# Patient Record
Sex: Female | Born: 1985 | Race: White | Hispanic: No | Marital: Married | Smoking: Former smoker
Health system: Southern US, Community
[De-identification: ages and names within clinical notes are randomized; demographics above are authoritative.]

## PROBLEM LIST (undated history)

## (undated) DIAGNOSIS — Z8619 Personal history of other infectious and parasitic diseases: Secondary | ICD-10-CM

## (undated) DIAGNOSIS — O021 Missed abortion: Secondary | ICD-10-CM

## (undated) DIAGNOSIS — J069 Acute upper respiratory infection, unspecified: Secondary | ICD-10-CM

## (undated) DIAGNOSIS — E109 Type 1 diabetes mellitus without complications: Secondary | ICD-10-CM

## (undated) DIAGNOSIS — E1110 Type 2 diabetes mellitus with ketoacidosis without coma: Secondary | ICD-10-CM

## (undated) DIAGNOSIS — F329 Major depressive disorder, single episode, unspecified: Secondary | ICD-10-CM

## (undated) DIAGNOSIS — F32A Depression, unspecified: Secondary | ICD-10-CM

## (undated) HISTORY — DX: Acute upper respiratory infection, unspecified: J06.9

## (undated) HISTORY — DX: Personal history of other infectious and parasitic diseases: Z86.19

## (undated) HISTORY — PX: WISDOM TOOTH EXTRACTION: SHX21

---
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
## 2012-03-28 ENCOUNTER — Emergency Department (HOSPITAL_COMMUNITY): Payer: BC Managed Care – PPO

## 2012-03-28 ENCOUNTER — Emergency Department (HOSPITAL_COMMUNITY)
Admission: EM | Admit: 2012-03-28 | Discharge: 2012-03-29 | Disposition: A | Discharge status: Home / Self Care | Payer: BC Managed Care – PPO | Attending: Emergency Medicine | Admitting: Emergency Medicine

## 2012-03-28 ENCOUNTER — Encounter (HOSPITAL_COMMUNITY): Payer: Self-pay | Admitting: Adult Health

## 2012-03-28 DIAGNOSIS — IMO0002 Reserved for concepts with insufficient information to code with codable children: Secondary | ICD-10-CM | POA: Insufficient documentation

## 2012-03-28 DIAGNOSIS — M7580 Other shoulder lesions, unspecified shoulder: Secondary | ICD-10-CM

## 2012-03-28 DIAGNOSIS — Z3202 Encounter for pregnancy test, result negative: Secondary | ICD-10-CM | POA: Insufficient documentation

## 2012-03-28 DIAGNOSIS — M75100 Unspecified rotator cuff tear or rupture of unspecified shoulder, not specified as traumatic: Secondary | ICD-10-CM | POA: Insufficient documentation

## 2012-03-28 DIAGNOSIS — R202 Paresthesia of skin: Secondary | ICD-10-CM

## 2012-03-28 DIAGNOSIS — R209 Unspecified disturbances of skin sensation: Secondary | ICD-10-CM | POA: Insufficient documentation

## 2012-03-28 DIAGNOSIS — F17200 Nicotine dependence, unspecified, uncomplicated: Secondary | ICD-10-CM | POA: Insufficient documentation

## 2012-03-28 DIAGNOSIS — E119 Type 2 diabetes mellitus without complications: Secondary | ICD-10-CM | POA: Insufficient documentation

## 2012-03-28 DIAGNOSIS — Z794 Long term (current) use of insulin: Secondary | ICD-10-CM | POA: Insufficient documentation

## 2012-03-28 HISTORY — DX: Type 1 diabetes mellitus without complications: E10.9

## 2012-03-28 LAB — BASIC METABOLIC PANEL
BUN: 11 mg/dL (ref 6–23)
CO2: 29 mEq/L (ref 19–32)
Chloride: 105 mEq/L (ref 96–112)
Creatinine, Ser: 0.75 mg/dL (ref 0.50–1.10)
Potassium: 4.2 mEq/L (ref 3.5–5.1)

## 2012-03-28 LAB — CBC WITH DIFFERENTIAL/PLATELET
HCT: 39.6 % (ref 36.0–46.0)
Hemoglobin: 13.7 g/dL (ref 12.0–15.0)
Lymphocytes Relative: 44 % (ref 12–46)
Monocytes Absolute: 0.5 10*3/uL (ref 0.1–1.0)
Monocytes Relative: 8 % (ref 3–12)
Neutro Abs: 2.5 10*3/uL (ref 1.7–7.7)
Neutrophils Relative %: 44 % (ref 43–77)
RBC: 4.55 MIL/uL (ref 3.87–5.11)
WBC: 5.8 10*3/uL (ref 4.0–10.5)

## 2012-03-28 LAB — GLUCOSE, CAPILLARY: Glucose-Capillary: 190 mg/dL — ABNORMAL HIGH (ref 70–99)

## 2012-03-28 NOTE — ED Notes (Addendum)
Pt reports right sided pain and heaviness  that began 45 minutes ago while driving, goes from right side of face to right arm to right leg.  denies blurred vision, denies SOB. No facial droop, no arm drift. Grip is equal bilaterally. C/o headache that began today but is currently gone. Alert and oriented, answers all questions appropriately. Hx of diabetes on insulin pump. Reports high stress lately. Pt is tearful. Denies SOB and chest pain.

## 2012-03-28 NOTE — ED Notes (Signed)
CBG checked 190

## 2012-03-29 MED ORDER — ASPIRIN 81 MG PO CHEW: 81.0000 mg | CHEWABLE_TABLET | Freq: Every day | ORAL | Status: DC

## 2012-03-29 NOTE — ED Notes (Signed)
Patient says she started feeling "awful tingling, pressure, lead weight" from her right shoulder down to her fingertips.  Patient says she is a diabetic and the feeling just scared her so she came to ED.

## 2012-03-29 NOTE — ED Notes (Signed)
Dr Otter at bedside  

## 2012-03-30 NOTE — ED Provider Notes (Signed)
History     CSN: [id number]  Arrival date & time 03/28/12  2101   First MD Initiated Contact with Patient 03/29/12 0349      Chief Complaint  Patient presents with  . Numbness    (Consider location/radiation/quality/duration/timing/severity/associated sxs/prior treatment) HPI 26 yo female presents to the ER with complaint of right shoulder pain over the last few weeks, with pins and needles/heaviness sensation in right arm, leg, and face this evening for about 4-5 hours.  Pt had headache earlier, but not during numbness feeling.  Pt reports she and her husband had finished dinner and were driving home when symptoms began.  No weakness or loss of function.  No slurred speech or difficulties with thinking.  No prior h/o same.  Pt has h/o diabetes, became concerned for stroke and presented to ED.  Negative stroke scale at presentation to triage.  Sxs all now resolved aside from right shoulder pain.  No known injury to right shoulder, but pt reports she moves furniture often.  Pain is usually worse at night/first thing in morning, pain with overhead activity.   Past Medical History  Diagnosis Date  . Diabetes mellitus type I     History reviewed. No pertinent past surgical history.  History reviewed. No pertinent family history.  History  Substance Use Topics  . Smoking status: Current Some Day Smoker  . Smokeless tobacco: Not on file  . Alcohol Use: No    OB History    Grav Para Term Preterm Abortions TAB SAB Ect Mult Living                  Review of Systems  All other systems reviewed and are negative.    Allergies  Review of patient's allergies indicates no known allergies.  Home Medications   Current Outpatient Rx  Name  Route  Sig  Dispense  Refill  . INSULIN PUMP   Subcutaneous   Inject into the skin.         . ASPIRIN 81 MG PO CHEW   Oral   Chew 1 tablet (81 mg total) by mouth daily.   30 tablet   0     BP 110/75  Pulse 75  Temp 97.7 F (36.5 C)  (Oral)  Resp 17  SpO2 100%  LMP 03/28/2012  Physical Exam  Nursing note and vitals reviewed. Constitutional: She is oriented to person, place, and time. She appears well-developed and well-nourished.  HENT:  Head: Normocephalic and atraumatic.  Right Ear: External ear normal.  Left Ear: External ear normal.  Nose: Nose normal.  Mouth/Throat: Oropharynx is clear and moist.  Eyes: Conjunctivae normal and EOM are normal. Pupils are equal, round, and reactive to light.  Neck: Normal range of motion. Neck supple. No JVD present. No tracheal deviation present. No thyromegaly present.  Cardiovascular: Normal rate, regular rhythm, normal heart sounds and intact distal pulses.  Exam reveals no gallop and no friction rub.   No murmur heard. Pulmonary/Chest: Effort normal and breath sounds normal. No stridor. No respiratory distress. She has no wheezes. She has no rales. She exhibits no tenderness.  Abdominal: Soft. Bowel sounds are normal. She exhibits no distension and no mass. There is no tenderness. There is no rebound and no guarding.  Musculoskeletal: Normal range of motion. She exhibits tenderness (TTP over rotator cuff, pain with impingment testing). She exhibits no edema.  Lymphadenopathy:    She has no cervical adenopathy.  Neurological: She is alert and oriented to person,  place, and time. She has normal reflexes. No cranial nerve deficit. She exhibits normal muscle tone. Coordination normal.  Skin: Skin is dry. No rash noted. No erythema. No pallor.  Psychiatric: She has a normal mood and affect. Her behavior is normal. Judgment and thought content normal.    ED Course  Procedures (including critical care time)  Labs Reviewed  BASIC METABOLIC PANEL - Abnormal; Notable for the following:    Glucose, Bld 204 (*)     All other components within normal limits  GLUCOSE, CAPILLARY - Abnormal; Notable for the following:    Glucose-Capillary 190 (*)     All other components within  normal limits  CBC WITH DIFFERENTIAL  POCT PREGNANCY, URINE  LAB REPORT - SCANNED   Ct Head Wo Contrast  03/28/2012  *RADIOLOGY REPORT*  Clinical Data: Headache, numbness.  CT HEAD WITHOUT CONTRAST  Technique:  Contiguous axial images were obtained from the base of the skull through the vertex without contrast.  Comparison: None.  Findings: No acute intracranial abnormality.  Specifically, no hemorrhage, hydrocephalus, mass lesion, acute infarction, or significant intracranial injury.  No acute calvarial abnormality. Visualized paranasal sinuses and mastoids clear.  Orbital soft tissues unremarkable.  IMPRESSION: Normal study.   Original Report Authenticated By: Charlett Nose, M.D.      1. Paresthesia of right arm and leg   2. Rotator cuff tendinitis       MDM  26 yo female with right sided paresthesias, also right shoulder pain.  Workup unremarkable.  Possible atypical migraine.  Will have her f/u with outpatient neurology. Do not feel sxs are due to TIA, stroke.        Olivia Mackie, MD 03/30/12 [phone]

## 2013-06-23 ENCOUNTER — Encounter (HOSPITAL_COMMUNITY): Payer: Self-pay | Admitting: Emergency Medicine

## 2013-06-23 ENCOUNTER — Emergency Department (HOSPITAL_COMMUNITY): Payer: BC Managed Care – PPO

## 2013-06-23 ENCOUNTER — Inpatient Hospital Stay (HOSPITAL_COMMUNITY)
Admission: EM | Admit: 2013-06-23 | Discharge: 2013-06-25 | DRG: 638 | Disposition: A | Discharge status: Home / Self Care | Payer: BC Managed Care – PPO | Attending: Internal Medicine | Admitting: Internal Medicine

## 2013-06-23 DIAGNOSIS — E109 Type 1 diabetes mellitus without complications: Secondary | ICD-10-CM | POA: Diagnosis present

## 2013-06-23 DIAGNOSIS — E1110 Type 2 diabetes mellitus with ketoacidosis without coma: Secondary | ICD-10-CM | POA: Diagnosis present

## 2013-06-23 DIAGNOSIS — E871 Hypo-osmolality and hyponatremia: Secondary | ICD-10-CM | POA: Diagnosis present

## 2013-06-23 DIAGNOSIS — J111 Influenza due to unidentified influenza virus with other respiratory manifestations: Secondary | ICD-10-CM

## 2013-06-23 DIAGNOSIS — R110 Nausea: Secondary | ICD-10-CM | POA: Diagnosis present

## 2013-06-23 DIAGNOSIS — F329 Major depressive disorder, single episode, unspecified: Secondary | ICD-10-CM

## 2013-06-23 DIAGNOSIS — F32A Depression, unspecified: Secondary | ICD-10-CM | POA: Diagnosis present

## 2013-06-23 DIAGNOSIS — E875 Hyperkalemia: Secondary | ICD-10-CM | POA: Diagnosis present

## 2013-06-23 DIAGNOSIS — R000 Tachycardia, unspecified: Secondary | ICD-10-CM | POA: Diagnosis present

## 2013-06-23 DIAGNOSIS — F3289 Other specified depressive episodes: Secondary | ICD-10-CM | POA: Diagnosis present

## 2013-06-23 DIAGNOSIS — F17200 Nicotine dependence, unspecified, uncomplicated: Secondary | ICD-10-CM | POA: Diagnosis present

## 2013-06-23 DIAGNOSIS — Z833 Family history of diabetes mellitus: Secondary | ICD-10-CM

## 2013-06-23 DIAGNOSIS — Z9641 Presence of insulin pump (external) (internal): Secondary | ICD-10-CM

## 2013-06-23 DIAGNOSIS — E1010 Type 1 diabetes mellitus with ketoacidosis without coma: Principal | ICD-10-CM | POA: Diagnosis present

## 2013-06-23 DIAGNOSIS — Z794 Long term (current) use of insulin: Secondary | ICD-10-CM

## 2013-06-23 HISTORY — DX: Depression, unspecified: F32.A

## 2013-06-23 HISTORY — DX: Type 2 diabetes mellitus with ketoacidosis without coma: E11.10

## 2013-06-23 HISTORY — DX: Major depressive disorder, single episode, unspecified: F32.9

## 2013-06-23 LAB — BASIC METABOLIC PANEL
BUN: 25 mg/dL — AB (ref 6–23)
BUN: 25 mg/dL — ABNORMAL HIGH (ref 6–23)
BUN: 23 mg/dL (ref 6–23)
BUN: 21 mg/dL (ref 6–23)
BUN: 20 mg/dL (ref 6–23)
BUN: 20 mg/dL (ref 6–23)
CALCIUM: 8.3 mg/dL — AB (ref 8.4–10.5)
CALCIUM: 8.2 mg/dL — AB (ref 8.4–10.5)
CALCIUM: 8.3 mg/dL — AB (ref 8.4–10.5)
CHLORIDE: 103 meq/L (ref 96–112)
CHLORIDE: 106 meq/L (ref 96–112)
CO2: 14 mEq/L — ABNORMAL LOW (ref 19–32)
CO2: 12 meq/L — AB (ref 19–32)
CO2: 15 meq/L — AB (ref 19–32)
CO2: 19 mEq/L (ref 19–32)
CO2: 20 mEq/L (ref 19–32)
CO2: 21 mEq/L (ref 19–32)
CREATININE: 0.83 mg/dL (ref 0.50–1.10)
CREATININE: 0.79 mg/dL (ref 0.50–1.10)
CREATININE: 0.76 mg/dL (ref 0.50–1.10)
CREATININE: 0.73 mg/dL (ref 0.50–1.10)
Calcium: 9.3 mg/dL (ref 8.4–10.5)
Calcium: 8.2 mg/dL — ABNORMAL LOW (ref 8.4–10.5)
Calcium: 8.2 mg/dL — ABNORMAL LOW (ref 8.4–10.5)
Chloride: 93 mEq/L — ABNORMAL LOW (ref 96–112)
Chloride: 102 mEq/L (ref 96–112)
Chloride: 104 mEq/L (ref 96–112)
Chloride: 105 mEq/L (ref 96–112)
Creatinine, Ser: 0.79 mg/dL (ref 0.50–1.10)
Creatinine, Ser: 0.73 mg/dL (ref 0.50–1.10)
GFR calc Af Amer: >90 mL/min (ref >90)
GFR calc Af Amer: >90 mL/min (ref >90)
GFR calc Af Amer: >90 mL/min (ref >90)
GFR calc Af Amer: >90 mL/min (ref >90)
GFR calc Af Amer: >90 mL/min (ref >90)
GFR calc Af Amer: >90 mL/min (ref >90)
GFR calc non Af Amer: >90 mL/min (ref >90)
GLUCOSE: 503 mg/dL — AB (ref 70–99)
GLUCOSE: 335 mg/dL — AB (ref 70–99)
GLUCOSE: 198 mg/dL — AB (ref 70–99)
Glucose, Bld: 255 mg/dL — ABNORMAL HIGH (ref 70–99)
Glucose, Bld: 168 mg/dL — ABNORMAL HIGH (ref 70–99)
Glucose, Bld: 146 mg/dL — ABNORMAL HIGH (ref 70–99)
POTASSIUM: 6.5 meq/L — AB (ref 3.7–5.3)
POTASSIUM: 4.4 meq/L (ref 3.7–5.3)
Potassium: 4.3 mEq/L (ref 3.7–5.3)
Potassium: 4.5 mEq/L (ref 3.7–5.3)
Potassium: 4.7 mEq/L (ref 3.7–5.3)
Potassium: 4.6 mEq/L (ref 3.7–5.3)
SODIUM: 136 meq/L — AB (ref 137–147)
SODIUM: 135 meq/L — AB (ref 137–147)
Sodium: 131 mEq/L — ABNORMAL LOW (ref 137–147)
Sodium: 135 mEq/L — ABNORMAL LOW (ref 137–147)
Sodium: 135 mEq/L — ABNORMAL LOW (ref 137–147)
Sodium: 136 mEq/L — ABNORMAL LOW (ref 137–147)

## 2013-06-23 LAB — INFLUENZA PANEL BY PCR (TYPE A & B)
H1N1 flu by pcr: NOT DETECTED
INFLAPCR: NEGATIVE
INFLBPCR: NEGATIVE

## 2013-06-23 LAB — CBC WITH DIFFERENTIAL/PLATELET
BASOS PCT: 0 % (ref 0–1)
Basophils Absolute: 0 10*3/uL (ref 0.0–0.1)
EOS ABS: 0 10*3/uL (ref 0.0–0.7)
Eosinophils Relative: 0 % (ref 0–5)
HEMATOCRIT: 41 % (ref 36.0–46.0)
HEMOGLOBIN: 14.2 g/dL (ref 12.0–15.0)
Lymphocytes Relative: 6 % — ABNORMAL LOW (ref 12–46)
Lymphs Abs: 0.9 10*3/uL (ref 0.7–4.0)
MCH: 30.9 pg (ref 26.0–34.0)
MCHC: 34.6 g/dL (ref 30.0–36.0)
MCV: 89.3 fL (ref 78.0–100.0)
MONO ABS: 0.7 10*3/uL (ref 0.1–1.0)
MONOS PCT: 5 % (ref 3–12)
Neutro Abs: 12.1 10*3/uL — ABNORMAL HIGH (ref 1.7–7.7)
Neutrophils Relative %: 88 % — ABNORMAL HIGH (ref 43–77)
Platelets: 262 10*3/uL (ref 150–400)
RBC: 4.59 MIL/uL (ref 3.87–5.11)
RDW: 11.9 % (ref 11.5–15.5)
WBC: 13.7 10*3/uL — ABNORMAL HIGH (ref 4.0–10.5)

## 2013-06-23 LAB — GLUCOSE, CAPILLARY
GLUCOSE-CAPILLARY: 194 mg/dL — AB (ref 70–99)
GLUCOSE-CAPILLARY: 181 mg/dL — AB (ref 70–99)
GLUCOSE-CAPILLARY: 130 mg/dL — AB (ref 70–99)
GLUCOSE-CAPILLARY: 108 mg/dL — AB (ref 70–99)
Glucose-Capillary: 254 mg/dL — ABNORMAL HIGH (ref 70–99)
Glucose-Capillary: 237 mg/dL — ABNORMAL HIGH (ref 70–99)
Glucose-Capillary: 169 mg/dL — ABNORMAL HIGH (ref 70–99)
Glucose-Capillary: 158 mg/dL — ABNORMAL HIGH (ref 70–99)
Glucose-Capillary: 143 mg/dL — ABNORMAL HIGH (ref 70–99)

## 2013-06-23 LAB — CBG MONITORING, ED
Glucose-Capillary: 511 mg/dL — ABNORMAL HIGH (ref 70–99)
Glucose-Capillary: 466 mg/dL — ABNORMAL HIGH (ref 70–99)
Glucose-Capillary: 455 mg/dL — ABNORMAL HIGH (ref 70–99)
Glucose-Capillary: 423 mg/dL — ABNORMAL HIGH (ref 70–99)
Glucose-Capillary: 362 mg/dL — ABNORMAL HIGH (ref 70–99)

## 2013-06-23 LAB — OSMOLALITY: Osmolality: 312 mOsm/kg — ABNORMAL HIGH (ref 275–300)

## 2013-06-23 LAB — URINALYSIS, ROUTINE W REFLEX MICROSCOPIC
BILIRUBIN URINE: NEGATIVE
Glucose, UA: >1000 mg/dL — AB
HGB URINE DIPSTICK: NEGATIVE
Ketones, ur: >80 mg/dL — AB
Leukocytes, UA: NEGATIVE
Nitrite: NEGATIVE
PROTEIN: NEGATIVE mg/dL
Specific Gravity, Urine: 1.02 (ref 1.005–1.030)
UROBILINOGEN UA: 0.2 mg/dL (ref 0.0–1.0)
pH: 5.5 (ref 5.0–8.0)

## 2013-06-23 LAB — MRSA PCR SCREENING: MRSA BY PCR: NEGATIVE

## 2013-06-23 LAB — MAGNESIUM: MAGNESIUM: 2.1 mg/dL (ref 1.5–2.5)

## 2013-06-23 LAB — URINE MICROSCOPIC-ADD ON

## 2013-06-23 LAB — PREGNANCY, URINE: PREG TEST UR: NEGATIVE

## 2013-06-23 MED ORDER — ONDANSETRON HCL 4 MG/2ML IJ SOLN
4.0000 mg | Freq: Once | INTRAMUSCULAR | Status: AC
  Administered 2013-06-23: 4 mg via INTRAMUSCULAR
  Filled 2013-06-23: qty 2

## 2013-06-23 MED ORDER — SODIUM CHLORIDE 0.9 % IV SOLN
1000.0000 mL | INTRAVENOUS | Status: DC
  Administered 2013-06-23: 1000 mL via INTRAVENOUS

## 2013-06-23 MED ORDER — HYDROCODONE-ACETAMINOPHEN 5-325 MG PO TABS
2.0000 | ORAL_TABLET | Freq: Once | ORAL | Status: AC
  Administered 2013-06-23: 2 via ORAL
  Filled 2013-06-23: qty 2

## 2013-06-23 MED ORDER — DEXTROSE-NACL 5-0.45 % IV SOLN
INTRAVENOUS | Status: DC
  Administered 2013-06-23: 18:00:00 via INTRAVENOUS

## 2013-06-23 MED ORDER — SODIUM POLYSTYRENE SULFONATE 15 GM/60ML PO SUSP
15.0000 g | Freq: Once | ORAL | Status: AC
  Administered 2013-06-23: 15 g via ORAL
  Filled 2013-06-23: qty 60

## 2013-06-23 MED ORDER — SODIUM CHLORIDE 0.9 % IV SOLN
INTRAVENOUS | Status: DC
  Administered 2013-06-23 (×2): via INTRAVENOUS

## 2013-06-23 MED ORDER — MORPHINE SULFATE 2 MG/ML IJ SOLN
2.0000 mg | Freq: Once | INTRAMUSCULAR | Status: DC
  Filled 2013-06-23: qty 1

## 2013-06-23 MED ORDER — INSULIN ASPART 100 UNIT/ML ~~LOC~~ SOLN: 0.0000 [IU] | Freq: Three times a day (TID) | SUBCUTANEOUS | Status: DC

## 2013-06-23 MED ORDER — ACETAMINOPHEN 325 MG PO TABS: 650.0000 mg | ORAL_TABLET | Freq: Four times a day (QID) | ORAL | Status: DC | PRN

## 2013-06-23 MED ORDER — DEXTROSE 50 % IV SOLN: 25.0000 mL | INTRAVENOUS | Status: DC | PRN

## 2013-06-23 MED ORDER — INSULIN GLARGINE 100 UNIT/ML ~~LOC~~ SOLN
20.0000 [IU] | Freq: Two times a day (BID) | SUBCUTANEOUS | Status: DC
  Administered 2013-06-23: 20 [IU] via SUBCUTANEOUS
  Filled 2013-06-23 (×2): qty 0.2

## 2013-06-23 MED ORDER — SODIUM CHLORIDE 0.9 % IV SOLN
1000.0000 mL | Freq: Once | INTRAVENOUS | Status: AC
  Administered 2013-06-23: 1000 mL via INTRAVENOUS

## 2013-06-23 MED ORDER — ENOXAPARIN SODIUM 40 MG/0.4ML ~~LOC~~ SOLN
40.0000 mg | Freq: Every day | SUBCUTANEOUS | Status: DC
  Administered 2013-06-23 – 2013-06-25 (×3): 40 mg via SUBCUTANEOUS
  Filled 2013-06-23 (×3): qty 0.4

## 2013-06-23 MED ORDER — SERTRALINE HCL 50 MG PO TABS
50.0000 mg | ORAL_TABLET | Freq: Every day | ORAL | Status: DC
  Administered 2013-06-23 – 2013-06-25 (×3): 50 mg via ORAL
  Filled 2013-06-23 (×3): qty 1

## 2013-06-23 MED ORDER — OSELTAMIVIR PHOSPHATE 75 MG PO CAPS
75.0000 mg | ORAL_CAPSULE | Freq: Two times a day (BID) | ORAL | Status: DC
  Filled 2013-06-23: qty 1

## 2013-06-23 MED ORDER — SODIUM CHLORIDE 0.9 % IV SOLN
INTRAVENOUS | Status: DC
  Administered 2013-06-23: 4 [IU]/h via INTRAVENOUS
  Filled 2013-06-23: qty 1

## 2013-06-23 MED ORDER — DEXTROSE 50 % IV SOLN: 50.0000 mL | INTRAVENOUS | Status: DC | PRN

## 2013-06-23 MED ORDER — KETOROLAC TROMETHAMINE 30 MG/ML IJ SOLN
30.0000 mg | Freq: Once | INTRAMUSCULAR | Status: AC
  Administered 2013-06-23: 30 mg via INTRAVENOUS
  Filled 2013-06-23: qty 1

## 2013-06-23 MED ORDER — GLUCOSE 40 % PO GEL: 1.0000 | ORAL | Status: DC | PRN

## 2013-06-23 MED ORDER — SODIUM CHLORIDE 0.9 % IV SOLN
INTRAVENOUS | Status: DC
  Administered 2013-06-23: 16:00:00 via INTRAVENOUS
  Filled 2013-06-23: qty 1

## 2013-06-23 MED ORDER — OSELTAMIVIR PHOSPHATE 75 MG PO CAPS
75.0000 mg | ORAL_CAPSULE | Freq: Once | ORAL | Status: AC
  Administered 2013-06-23: 75 mg via ORAL
  Filled 2013-06-23: qty 1

## 2013-06-23 MED ORDER — ONDANSETRON HCL 4 MG/2ML IJ SOLN
4.0000 mg | Freq: Once | INTRAMUSCULAR | Status: AC
  Administered 2013-06-23: 4 mg via INTRAVENOUS
  Filled 2013-06-23: qty 2

## 2013-06-23 MED ORDER — ONDANSETRON HCL 4 MG/2ML IJ SOLN
4.0000 mg | Freq: Four times a day (QID) | INTRAMUSCULAR | Status: DC | PRN
  Administered 2013-06-24 (×2): 4 mg via INTRAVENOUS
  Filled 2013-06-23 (×2): qty 2

## 2013-06-23 MED ORDER — ENOXAPARIN SODIUM 40 MG/0.4ML ~~LOC~~ SOLN: 40.0000 mg | SUBCUTANEOUS | Status: DC

## 2013-06-23 MED ORDER — INSULIN ASPART 100 UNIT/ML ~~LOC~~ SOLN: 0.0000 [IU] | Freq: Every day | SUBCUTANEOUS | Status: DC

## 2013-06-23 MED ORDER — INSULIN ASPART 100 UNIT/ML ~~LOC~~ SOLN: 4.0000 [IU] | Freq: Three times a day (TID) | SUBCUTANEOUS | Status: DC

## 2013-06-23 NOTE — H&P (Signed)
[facility] History and Physical  Norma Allen AQT:[id number] DOB: Dec 03, 1985 DOA: 06/23/2013  Referring physician: zammit PCP: Norma Hilding, MD  Endocrinologist Dr. Reynold Bowen  Chief Complaint: vomiting  HPI: Norma Allen is a 28 y.o. female with a past medical history that includes type 1 diabetes she is on an insulin pump, depression presents to the emergency department today with the chief complaint of generalized achiness nausea vomiting. Initial workup in the emergency room reveals the patient is in DKA with an anion gap of 24, serum glucose 511, potassium 6.5. Vital signs were stable but she had a heart rate of 127. She was afebrile and not hypoxic   Information is obtained from the patient. She states that 2 days ago in the morning she developed sudden nausea. She states that she did not vomit that day she took some Phenergan and Pepto-Bismol  and slept most of the day. Her blood sugar at that time was 133. Later that evening she woke up and her nausea had subsided and she was able to eat dinner. She slept through the night without problem. However yesterday she awakened again with nausea and generalized achiness. She went to her primary care provider who diagnosed her with flu and started her on Tamiflu. She was also instructed that if she began to vomit she should come to the emergency department. She indicated that her blood sugars were running "high" during this time. The achiness and generalized weakness worsened. She was unable to eat. She began vomiting last evening and this morning she was no better so she came to the emergency room. She denies any fever chills dysuria hematuria frequency or urgency. She denies diarrhea or constipation. In the emergency department she was given 2 L of normal saline and an insulin drip was initiated. In addition she was given 15 g of Kayexalate, Zofran and Toradol. We are asked to admit    Review of Systems:  10 point review of systems  completed and all systems are negative except as indicated in the history of present illness   Past Medical History  Diagnosis Date  . Diabetes mellitus type I   . DKA (diabetic ketoacidoses)   . Depression    Past Surgical History  Procedure Laterality Date  . Wisdom tooth extraction     Social History:  reports that she has been smoking Cigarettes.  She has a 2 pack-year smoking history. She has never used smokeless tobacco. She reports that she does not drink alcohol or use illicit drugs. She smokes occasionally her last cigarette was 2 days ago. She is a social drinker. She denies any illicit drug use. 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 No Known Allergies  Family History  Problem Relation Age of Onset  . Cancer Mother   . Diabetes Father      Prior to Admission medications   Medication Sig Start Date End Date Taking? Authorizing Provider  fluticasone (FLONASE) 50 MCG/ACT nasal spray Place 1 spray into both nostrils daily. 03/22/13  Yes Historical Provider, MD  insulin glargine (LANTUS) 100 UNIT/ML injection Inject 32-36 Units into the skin 2 (two) times daily.    Yes Historical Provider, MD  NOVOLOG 100 UNIT/ML injection Inject 3.7-5.2 Units into the skin 3 (three) times daily with meals.  05/26/13  Yes Historical Provider, MD  sertraline (ZOLOFT) 50 MG tablet Take 50 mg by mouth daily. 05/22/13  Yes Historical Provider, MD  TAMIFLU 75 MG capsule Take 75 mg by  mouth 2 (two) times daily. 06/22/13  Yes Historical Provider, MD  Insulin Human (INSULIN PUMP) 100 unit/ml SOLN Inject into the skin continuous.     Historical Provider, MD   Physical Exam: Filed Vitals:   06/23/13 1129  BP: 114/47  Pulse: 127  Temp: 97.7 F (36.5 C)  Resp: 25    BP 114/47  Pulse 127  Temp(Src) 97.7 F (36.5 C) (Oral)  Resp 25  Ht _0  (1.676 m)  Wt 63.957 kg (141 lb)  BMI 22.77 kg/m2  SpO2 100%  LMP 06/16/2013  General:  Well-nourished somewhat ill  appearing no acute distress Eyes: PERRL, normal lids, irises & conjunctiva ENT: Ears are clear nose without drainage oropharynx without erythema or exudate. Mucous membranes of her mouth are pink but dry Neck: no LAD, masses or thyromegaly Cardiovascular: Tachycardic but regular, no m/r/g. No LE edema. Respiratory: CTA bilaterally, no w/r/r. Normal respiratory effort. Abdomen: soft, positive bowel sounds but somewhat sluggish. Mild tenderness to epigastric area on palpation. Otherwise nontender nondistended. No mass organomegaly noted Skin: no rash or induration seen on limited exam Musculoskeletal: grossly normal tone BUE/BLE Psychiatric: grossly normal mood and affect, speech fluent and appropriate Neurologic: grossly non-focal.          Labs on Admission:  Basic Metabolic Panel:  Recent Labs Lab 06/23/13 1023  NA 131*  K 6.5*  CL 93*  CO2 14*  GLUCOSE 503*  BUN 25*  CREATININE 0.79  CALCIUM 9.3  MG 2.1   Liver Function Tests: No results found for this basename: AST, ALT, ALKPHOS, BILITOT, PROT, ALBUMIN,  in the last 168 hours No results found for this basename: LIPASE, AMYLASE,  in the last 168 hours No results found for this basename: AMMONIA,  in the last 168 hours CBC:  Recent Labs Lab 06/23/13 0856  WBC 13.7*  NEUTROABS 12.1*  HGB 14.2  HCT 41.0  MCV 89.3  PLT 262   Cardiac Enzymes: No results found for this basename: CKTOTAL, CKMB, CKMBINDEX, TROPONINI,  in the last 168 hours  BNP (last 3 results) No results found for this basename: PROBNP,  in the last 8760 hours CBG:  Recent Labs Lab 06/23/13 0836 06/23/13 1006 06/23/13 1134  GLUCAP 511* 466* 455*    Radiological Exams on Admission: Dg Chest Portable 1 View  06/23/2013   CLINICAL DATA:  Influenza.  Smoker.  EXAM: PORTABLE CHEST - 1 VIEW  COMPARISON:  No priors.  FINDINGS: Lung volumes are normal. No consolidative airspace disease. No pleural effusions. No pneumothorax. No pulmonary nodule or  mass noted. Pulmonary vasculature and the cardiomediastinal silhouette are within normal limits.  IMPRESSION: 1.  No radiographic evidence of acute cardiopulmonary disease.   Electronically Signed   By: Vinnie Langton M.D.   On: 06/23/2013 12:21    EKG: Independently reviewed sinus tachycardia with nonspecific ST-T wave Assessment/Plan Principal Problem:   DKA (diabetic ketoacidoses): Likely related to acute illness in the setting of slight noncompliance due to insulin calm issues. Chest x-ray and urinalysis are unremarkable. Will admit to step down. Will provide insulin drip and follow Glucomander protocol. Monitor be met every 4 hours per protocol. We'll transition her fluids when indicated. Will also request diabetic coordinator consult to assist with transitioning her back to her pump when indicated. Will keep her n.p.o. except for ice chips for now.  Active Problems: Hyperkalemia: Related to above. She was given 15 g of Kayexalate in the emergency department. He was also given 2 L of normal  saline. Will monitor electrolytes as indicated above. I expect her potassium level to improve as her acidosis resolves.    Hyponatremia: Related to #1. IV fluids per protocol. Basic metabolic panel every 4 hours. Will follow closely    Tachycardia: Related to #1. She has been given 2 L of normal saline I will continue vigorous IV resuscitation. EKG with some nonspecific ST and T wave abnormalities. Will repeat EKG in the a.m.     Nausea: Had some vomiting last night and this morning. Denies coffee ground emesis. May be related to flulike illness complicated by DKA. Influenza panel pending. Will continue her Tamiflu until those results are back. Will keep her n.p.o. except for ice chips. Will provide supportive therapy. Expect this will resolve as #1 improves.    Diabetes mellitus type I: Diagnosed at age 62. She is on an insulin pump at home but explains that she has had some difficulty with supplies of  late. Dr. Kevan Ny is her endocrinologist.    Depression: Appears stable at baseline. Will continue her home      Code Status: full Family Communication: significant other at bedside Disposition Plan: home when ready hopefully 48 hours  Time spent: 60 minutes  [facility] Pager [phone]

## 2013-06-23 NOTE — ED Notes (Signed)
CRITICAL VALUE ALERT  Critical value received:  Potassium - 6.5  Date of notification:  06/23/2013  Time of notification:  1102  Critical value read back: yes  Nurse who received alert:  LJS  MD notified (1st page):  Hobson  Time of first page:  1103  MD notified (2nd page):  Time of second page:  Responding MD:  Link SnufferHobson  Time MD responded:  [phone]

## 2013-06-23 NOTE — ED Provider Notes (Signed)
Pt with vomiting and aches,  pe lungs clear,  Heart rapid rate.   And neg.  Admit for dka. Medical screening examination/treatment/procedure(s) were conducted as a shared visit with non-physician practitioner(s) and myself.  I personally evaluated the patient during the encounter.   EKG Interpretation None        Benny LennertJoseph L Staci Carver, MD 06/23/13 1209

## 2013-06-23 NOTE — ED Notes (Signed)
MD ordered to increase 2nd bag of fluids to be given as a bolus. Increased fluid rate to 97499ml/hr.

## 2013-06-23 NOTE — ED Provider Notes (Signed)
CSN: [id number]     Arrival date & time 06/23/13  0813 History   First MD Initiated Contact with Patient 06/23/13 0818     Chief Complaint  Patient presents with  . Influenza     (Consider location/radiation/quality/duration/timing/severity/associated sxs/prior Treatment) HPI Comments: Patient is a 28 year old female who presents to the emergency department with complaint of" influenza". The patient states that she has been ill for the past 4 days. She has been having problems with vomiting, body aches and generally not feeling well. She is an insulin requiring diabetic, and has not been able to take her insulin. It is of note that she uses an insulin pump. The patient has been unable to eat or keep medications down. The patient was seen by her primary care physician on yesterday March 2. She was told that if she was still unable to eat or keep her medicines down or control the vomiting to go to the emergency department. The patient continues to have nausea vomiting. She is concerned about diabetic ketoacidosis because of a previous history of the same.  Patient is a 28 y.o. female presenting with flu symptoms. The history is provided by the patient.  Influenza Presenting symptoms: fatigue, headache, nausea and vomiting   Presenting symptoms: no cough and no shortness of breath     Past Medical History  Diagnosis Date  . Diabetes mellitus type I   . DKA (diabetic ketoacidoses)   . Depression    Past Surgical History  Procedure Laterality Date  . Wisdom tooth extraction     Family History  Problem Relation Age of Onset  . Cancer Mother   . Diabetes Father    History  Substance Use Topics  . Smoking status: Current Some Day Smoker -- 0.50 packs/day for 4 years    Types: Cigarettes  . Smokeless tobacco: Never Used  . Alcohol Use: No   OB History   Grav Para Term Preterm Abortions TAB SAB Ect Mult Living            0     Review of Systems  Constitutional: Positive for  fatigue. Negative for activity change.       All ROS Neg except as noted in HPI  HENT: Negative for nosebleeds.   Eyes: Negative for photophobia and discharge.  Respiratory: Negative for cough, shortness of breath and wheezing.   Cardiovascular: Negative for chest pain and palpitations.  Gastrointestinal: Positive for nausea and vomiting. Negative for abdominal pain and blood in stool.  Genitourinary: Negative for dysuria, frequency and hematuria.  Musculoskeletal: Negative for arthralgias, back pain and neck pain.  Skin: Negative.   Neurological: Positive for headaches. Negative for dizziness, seizures and speech difficulty.  Psychiatric/Behavioral: Negative for hallucinations and confusion.       Depression      Allergies  Review of patient's allergies indicates no known allergies.  Home Medications   Current Outpatient Rx  Name  Route  Sig  Dispense  Refill  . fluticasone (FLONASE) 50 MCG/ACT nasal spray   Each Nare   Place 1 spray into both nostrils daily.         . insulin glargine (LANTUS) 100 UNIT/ML injection   Subcutaneous   Inject 32-36 Units into the skin 2 (two) times daily.          Marland Kitchen NOVOLOG 100 UNIT/ML injection   Subcutaneous   Inject 3.7-5.2 Units into the skin 3 (three) times daily with meals.          Marland Kitchen  sertraline (ZOLOFT) 50 MG tablet   Oral   Take 50 mg by mouth daily.         Marland Kitchen. TAMIFLU 75 MG capsule   Oral   Take 75 mg by mouth 2 (two) times daily.         . Insulin Human (INSULIN PUMP) 100 unit/ml SOLN   Subcutaneous   Inject into the skin continuous.           BP 122/73  Pulse 132  Temp(Src) 98.3 F (36.8 C) (Oral)  Resp 20  Ht 5\' 6"  (1.676 m)  Wt 141 lb (63.957 kg)  BMI 22.77 kg/m2  SpO2 100%  LMP 06/16/2013 Physical Exam  HENT:  Mucous membranes dry  Cardiovascular: Tachycardia present.   Musculoskeletal:  Upper extremities sore to palpation and attempted range of motion. No hot joints appreciated. Some soreness of  the lower extremities to palpation. No hot joints appreciated.    ED Course  Procedures (including critical care time)CRITICAL CARE Performed by: Kathie DikeBRYANT,Nevena Rozenberg M Total critical care time: *45min** Critical care time was exclusive of separately billable procedures and treating other patients. Critical care was necessary to treat or prevent imminent or life-threatening deterioration. Critical care was time spent personally by me on the following activities: development of treatment plan with patient and/or surrogate as well as nursing, discussions with consultants, evaluation of patient's response to treatment, examination of patient, obtaining history from patient or surrogate, ordering and performing treatments and interventions, ordering and review of laboratory studies, ordering and review of radiographic studies, pulse oximetry and re-evaluation of patient's condition. Labs Review Labs Reviewed  CBC WITH DIFFERENTIAL - Abnormal; Notable for the following:    WBC 13.7 (*)    Neutrophils Relative % 88 (*)    Neutro Abs 12.1 (*)    Lymphocytes Relative 6 (*)    All other components within normal limits  URINALYSIS, ROUTINE W REFLEX MICROSCOPIC - Abnormal; Notable for the following:    Glucose, UA >1000 (*)    Ketones, ur >80 (*)    All other components within normal limits  URINE MICROSCOPIC-ADD ON - Abnormal; Notable for the following:    Squamous Epithelial / LPF MANY (*)    All other components within normal limits  CBG MONITORING, ED - Abnormal; Notable for the following:    Glucose-Capillary 511 (*)    All other components within normal limits  CBG MONITORING, ED - Abnormal; Notable for the following:    Glucose-Capillary 466 (*)    All other components within normal limits  BASIC METABOLIC PANEL  OSMOLALITY   Imaging Review No results found.   EKG Interpretation None      MDM Patient states she has not been able to keep her medicine down or eat meals on a regular  basis for the past 4 days. Mucous membranes dry. Patient having body aches.  After a liter of IV fluids the patient's glucose improved from a 511- to 466. Patient continues to have body aches even after IV Toradol. Oral Norco given as the patient's blood pressure is 104/43.  Urinalysis reveals a clear yellow specimen with a specific gravity 1.020. Glucose elevated at greater than 1000, ketones greater than 80. Complete blood count reveals a white blood cells to be elevated at 13,700, neutrophils elevated at 88%.  After second liter of IV fluids the blood pressure is improving some and the heart rate is also improving but remains tachycardic.  Basic metabolic panel reveals a critical potassium level at  6.5. Sodium is low at 131, chloride low at 93, CO2 low at 14. The glucose is elevated at 503. Osmolality is elevated at 312. Magnesium is normal at 2.1.  The patient is started on glucose stabilizing protocol. Patient seen with me by Dr. Estell Harpin. Case discussed with the hospitalist for admission. Patient will be admitted to the step down unit. I have discussed the findings and the plan for discharge with the patient, questions answered, patient is in agreement with the discharge plan.    Final diagnoses:  None    *I have reviewed nursing notes, vital signs, and all appropriate lab and imaging results for this patient.Kathie Dike, PA-C 06/23/13 [phone]

## 2013-06-23 NOTE — ED Notes (Signed)
Patient c/o body aches and vomiting. Per patient seen yesterday by PCP, diagnosed with flu. Patient was told to come to ER if she started vomiting. Per patient vomiting last night, unable to keep medication down. Patient diabetic with hx of keto acidosis. Per patient no fevers since Sunday.

## 2013-06-23 NOTE — H&P (Signed)
   I agree with the History/assesment & plan per Midlevel provider as per above, and independently assessed and discussed the plan of car with the patient  Patient known diabetic since age 28, one episode of DKA at age 28 [facility]-the episode was precipitated by similar viral illness. 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 The onset was sudden and began with a nauseous episode 06/21/13 a.m. to 30. She's only taken 2 doses of her Tamiflu.  No one else around her is sick She currently feels a little loopy from pain medications, but otherwise is feeling a little better She's been told in the past that she may have some thyroid enlargement by her endocrinologist but this was not elucidated further.   O/e Alert pleasant oriented slightly sleepy no pallor no icterus Neck soft supple do not appreciate any thyromegaly Mucosa dry Chest clinically clear Abdomen soft nontender no rebound   P-as per history of present illness and plan As H. influenzae neg, will discontinue Tamiflu Use Tylenol or ibuprofen for when necessary myalgias Defer further testing for etiology at present time as this is typically self-limiting-leukocytosis could be stress response vs. actual infection but we will hold off on further antibiotics at present Repeat bmet ordered stat to determine if hyperkalemia is better-EKG shows rate related changes but also mild hyperkalemia in terms of the T waves. Meets criteria for volume depletion with BUN 25/creatinine 0.7--continue IV fluids at least overnight aggressively Patient understands need for further invasive and continuous testing Urine pregnancy test is pending  Pleas KochJai Giovan Pinsky, MD [facility] [phone]

## 2013-06-24 DIAGNOSIS — E109 Type 1 diabetes mellitus without complications: Secondary | ICD-10-CM

## 2013-06-24 DIAGNOSIS — R000 Tachycardia, unspecified: Secondary | ICD-10-CM

## 2013-06-24 LAB — BASIC METABOLIC PANEL
BUN: 18 mg/dL (ref 6–23)
BUN: 17 mg/dL (ref 6–23)
BUN: 16 mg/dL (ref 6–23)
BUN: 17 mg/dL (ref 6–23)
BUN: 15 mg/dL (ref 6–23)
CALCIUM: 8 mg/dL — AB (ref 8.4–10.5)
CALCIUM: 8.4 mg/dL (ref 8.4–10.5)
CALCIUM: 8.2 mg/dL — AB (ref 8.4–10.5)
CHLORIDE: 103 meq/L (ref 96–112)
CO2: 18 mEq/L — ABNORMAL LOW (ref 19–32)
CO2: 19 meq/L (ref 19–32)
CO2: 22 meq/L (ref 19–32)
CO2: 22 mEq/L (ref 19–32)
CO2: 22 mEq/L (ref 19–32)
CREATININE: 0.7 mg/dL (ref 0.50–1.10)
CREATININE: 0.66 mg/dL (ref 0.50–1.10)
CREATININE: 0.64 mg/dL (ref 0.50–1.10)
Calcium: 7.9 mg/dL — ABNORMAL LOW (ref 8.4–10.5)
Calcium: 8.6 mg/dL (ref 8.4–10.5)
Chloride: 103 mEq/L (ref 96–112)
Chloride: 104 mEq/L (ref 96–112)
Chloride: 104 mEq/L (ref 96–112)
Chloride: 107 mEq/L (ref 96–112)
Creatinine, Ser: 0.67 mg/dL (ref 0.50–1.10)
Creatinine, Ser: 0.7 mg/dL (ref 0.50–1.10)
GFR calc Af Amer: >90 mL/min (ref >90)
GFR calc Af Amer: >90 mL/min (ref >90)
GFR calc Af Amer: >90 mL/min (ref >90)
GFR calc non Af Amer: >90 mL/min (ref >90)
GFR calc non Af Amer: >90 mL/min (ref >90)
GFR calc non Af Amer: >90 mL/min (ref >90)
GLUCOSE: 230 mg/dL — AB (ref 70–99)
Glucose, Bld: 312 mg/dL — ABNORMAL HIGH (ref 70–99)
Glucose, Bld: 296 mg/dL — ABNORMAL HIGH (ref 70–99)
Glucose, Bld: 259 mg/dL — ABNORMAL HIGH (ref 70–99)
Glucose, Bld: 211 mg/dL — ABNORMAL HIGH (ref 70–99)
POTASSIUM: 4.1 meq/L (ref 3.7–5.3)
Potassium: 4.2 mEq/L (ref 3.7–5.3)
Potassium: 3.5 mEq/L — ABNORMAL LOW (ref 3.7–5.3)
Potassium: 3.8 mEq/L (ref 3.7–5.3)
Potassium: 3.8 mEq/L (ref 3.7–5.3)
Sodium: 135 mEq/L — ABNORMAL LOW (ref 137–147)
Sodium: 135 mEq/L — ABNORMAL LOW (ref 137–147)
Sodium: 136 mEq/L — ABNORMAL LOW (ref 137–147)
Sodium: 135 mEq/L — ABNORMAL LOW (ref 137–147)
Sodium: 139 mEq/L (ref 137–147)

## 2013-06-24 LAB — HEMOGLOBIN A1C
Hgb A1c MFr Bld: 8.6 % — ABNORMAL HIGH (ref <5.7)
Mean Plasma Glucose: 200 mg/dL — ABNORMAL HIGH (ref <117)

## 2013-06-24 LAB — GLUCOSE, CAPILLARY
GLUCOSE-CAPILLARY: 195 mg/dL — AB (ref 70–99)
GLUCOSE-CAPILLARY: 98 mg/dL (ref 70–99)
Glucose-Capillary: 277 mg/dL — ABNORMAL HIGH (ref 70–99)
Glucose-Capillary: 209 mg/dL — ABNORMAL HIGH (ref 70–99)
Glucose-Capillary: 140 mg/dL — ABNORMAL HIGH (ref 70–99)
Glucose-Capillary: 161 mg/dL — ABNORMAL HIGH (ref 70–99)

## 2013-06-24 LAB — CBC
HCT: 34.1 % — ABNORMAL LOW (ref 36.0–46.0)
HEMOGLOBIN: 11.8 g/dL — AB (ref 12.0–15.0)
MCH: 30.4 pg (ref 26.0–34.0)
MCHC: 34.6 g/dL (ref 30.0–36.0)
MCV: 87.9 fL (ref 78.0–100.0)
Platelets: 238 10*3/uL (ref 150–400)
RBC: 3.88 MIL/uL (ref 3.87–5.11)
RDW: 12.2 % (ref 11.5–15.5)
WBC: 10.6 10*3/uL — ABNORMAL HIGH (ref 4.0–10.5)

## 2013-06-24 LAB — TSH: TSH: 1.646 u[IU]/mL (ref 0.350–4.500)

## 2013-06-24 MED ORDER — INSULIN ASPART 100 UNIT/ML ~~LOC~~ SOLN: 0.0000 [IU] | Freq: Three times a day (TID) | SUBCUTANEOUS | Status: DC

## 2013-06-24 MED ORDER — POTASSIUM CHLORIDE IN NACL 20-0.9 MEQ/L-% IV SOLN
INTRAVENOUS | Status: DC
  Administered 2013-06-24: 11:00:00 via INTRAVENOUS

## 2013-06-24 MED ORDER — INSULIN ASPART 100 UNIT/ML ~~LOC~~ SOLN: 0.0000 [IU] | Freq: Every day | SUBCUTANEOUS | Status: DC

## 2013-06-24 MED ORDER — INSULIN GLARGINE 100 UNIT/ML ~~LOC~~ SOLN
30.0000 [IU] | Freq: Every day | SUBCUTANEOUS | Status: DC
  Administered 2013-06-24: 30 [IU] via SUBCUTANEOUS
  Filled 2013-06-24 (×2): qty 0.3

## 2013-06-24 MED ORDER — INSULIN GLARGINE 100 UNIT/ML ~~LOC~~ SOLN
20.0000 [IU] | Freq: Every day | SUBCUTANEOUS | Status: DC
  Administered 2013-06-24: 20 [IU] via SUBCUTANEOUS
  Filled 2013-06-24: qty 0.2

## 2013-06-24 MED ORDER — INSULIN ASPART 100 UNIT/ML ~~LOC~~ SOLN
0.0000 [IU] | SUBCUTANEOUS | Status: DC
  Administered 2013-06-24: 7 [IU] via SUBCUTANEOUS
  Administered 2013-06-24: 4 [IU] via SUBCUTANEOUS

## 2013-06-24 MED ORDER — INSULIN ASPART 100 UNIT/ML ~~LOC~~ SOLN
0.0000 [IU] | SUBCUTANEOUS | Status: DC
  Administered 2013-06-24: 11 [IU] via SUBCUTANEOUS

## 2013-06-24 MED ORDER — POTASSIUM CHLORIDE CRYS ER 20 MEQ PO TBCR
30.0000 meq | EXTENDED_RELEASE_TABLET | Freq: Two times a day (BID) | ORAL | Status: AC
  Administered 2013-06-24 (×2): 30 meq via ORAL
  Filled 2013-06-24 (×4): qty 1

## 2013-06-24 NOTE — Care Management Note (Signed)
UR completed 

## 2013-06-24 NOTE — Progress Notes (Signed)
Nutrition Brief Note  Patient identified on the Malnutrition Screening Tool (MST) Report  Wt Readings from Last 15 Encounters:  06/24/13 144 lb 6.4 oz (65.5 kg)    Body mass index is 23.32 kg/(m^2). Patient meets criteria for normal weight based on current BMI.   Current diet order is carb modified, patient is consuming approximately 85% of meals at this time. Labs and medications reviewed.   No nutrition interventions warranted at this time. If nutrition issues arise, please consult RD.   Jarelyn Bambach A. Mayford KnifeWilliams, RD, LDN Pager: [phone]

## 2013-06-24 NOTE — Progress Notes (Signed)
Inpatient Diabetes Program Recommendations  AACE/ADA: New Consensus Statement on Inpatient Glycemic Control (2013)  Target Ranges:  Prepandial:   less than 140 mg/dL      Peak postprandial:   less than 180 mg/dL (1-2 hours)      Critically ill patients:  140 - 180 mg/dL   Results for Norma Allen, Norma Allen (MRN [id number]) as of 06/24/2013 08:17  Ref. Range 06/23/2013 18:30 06/23/2013 19:25 06/23/2013 20:25 06/23/2013 21:23 06/23/2013 22:13 06/23/2013 23:15 06/24/2013 00:18 06/24/2013 07:21  Glucose-Capillary Latest Range: 70-99 mg/dL 952 (H) 841 (H) 324 (H) 143 (H) 130 (H) 108 (H) 98 277 (H)   Diabetes history: DM1 Outpatient Diabetes medications: Lantus 36 units QAM, Lantus 32 units QPM, Novolog correction scale. When using Omnipod: Basal setting: 0.9 units/hour (21.6 units/24H); Insulin sensitivity 1:30 (1 unit drops glucose 30 mg/dl); Insulin to carb ratio: 1:10 (1 unit for 10 grams) Current orders for Inpatient glycemic control: Lantus 20 units BID, Novolog 0-20 units Q4H  Inpatient Diabetes Program Recommendations Insulin - Basal: Please order Lantus 30 units QAM and Lantus 20 units QHS. Correction (SSI): Please consider decreasing Novolog correction to senstive scale Q4H. Insulin - Meal Coverage: Please consider ordering Novolog 0-10 units TID for meal coverage (1 unit for every 10 grams of carbs).  Note: Talked with patient over the phone and patient reports that she was using Lantus and Novolog over the past 4 weeks because she has been having issues with getting Omnipod insulin pump supplies from [facility].  According to the patient she was taking Lantus 36 units in the morning, Lantus 32 untis at night, and Novolog correction (based on what PDM (Personal Diabetes Manager) indicated should be given based on blood sugar and carbs).  Patient is followed by Dr. Evlyn Kanner and last seen Dr. Evlyn Kanner on 05/08/13.  She reports her A1C has been improving since she has started on the Omnipod about 1 1/2 years ago  and her last A1C was 8.1%.  She states that when she is not working her blood sugar stays in the mid to upper 100's mg/dl range.  She works as a Engineer, site and she reports that when she is working her blood sugar drops sometimes "because she is more active when working and is constantly going". Patient reports that she can feel a low blood sugar when her glucose gets to be around 60 mg/dl.  In talking with the patient she reports that when her husband went home yesterday he received her Omnipod insulin pump supplies she has been waiting on for 4 weeks.  Patient prefers to go back on the Omnipod when MD feels she is ready to transition back to the insulin pump.  She reports that her husband brought all her supplies she will need to restart the Omnipod and she has them at the bedside with her.    Called Dr. Rinaldo Cloud office to verify insulin pump settings and diabetes management regimen.  Talked with Baird Lyons, RN and was given the following information: Basal rate: Not documented in chart Glucose target: 120 mg/dl Sensitivity: 4:01 mg/dl Carb ratio: 0:27  When not using Omnipod, pt is to take Lantus/Humalog: Lantus 32 units in the morning, Lantus 22 units at bedtime, and Humalog correction scale. Asked patient if she was taking Humalog or Novolog and she reports that she use to take Humalog but her insurance will no longer cover Humalog so she had to change to Novolog.  If patient is not being transitioned back to the insulin pump  at this time, please order Lantus 30 units QAM, Lantus 20 units QHS, Novolog sensitive correction Q4H, Novolog 0-10 units TID (1 units per 10 grams of carbs).  Will continue to follow.  Thanks, Orlando PennerMarie Raianna Slight, RN, MSN, CCRN Diabetes Coordinator Inpatient Diabetes Program [phone] (Team Pager) [phone] (AP office) [phone] [facility] office)

## 2013-06-24 NOTE — Progress Notes (Signed)
The patient was seen and examined. She was discussed with nurse practitioner, Ms. Vedia CofferBlack. Agree with her assessment and findings with additions below. The patient has no complaints other than transient nausea which has passed. Based on the last 2 basic metabolic panels, her anion gap has closed. Her serum potassium is borderline low, which is the expected given the improved glycemia. Will add potassium chloride supplementation orally and added to the IV fluids.. The recommendations by the diabetes coordinator is noted, but until her glycemic control improves more, we'll change her back to resistant scale every 4 hours with plans to change it to q. a.c. and each bedtime later this evening or in the morning. We'll order a hemoglobin A1c. We'll also order TSH for workup of tachycardia, although, the patient says that her heart rate is elevated at baseline. Per my auscultative exam, there is an S1, S2, with mild tachycardia and no murmurs rubs or gallops otherwise.

## 2013-06-24 NOTE — Progress Notes (Signed)
[facility] PROGRESS NOTE  Norma Allen:[id number] DOB: 09/09/1985 DOA: 06/23/2013 PCP: Estanislado PandySASSER,PAUL W, MD   Summary 28 year old type 1 diabetic on insulin pump presented with persistent nausea and vomiting found to be in DKA likely related to acute illness.  Assessment/Plan: DKA (diabetic ketoacidoses): Likely related to acute illness in the setting of slight noncompliance due to insulin pump issues. Much improved this am. Anion gap closed to 10 during night and insulin drip discontinued. This am anion gap 13 with CBG 277. Will obtain CBG every 4 hours and BMET every 2 hours x4. If gap widens further will consider re-starting insulin drip. Appetite fair this morning. Continue IV fluids. Appreciate diabetic coordinator input. Have ordered Lantus and SSI with meal coverage per recommendations. will monitor closely. May be able to transfer to medical floor this afternoon if anion gap is stable.   Active Problems:   Hyperkalemia: Related to above. She was given 15 g of Kayexalate in the emergency department. Resolved this am. Monitor closely.    Hyponatremia: Related to #1. IV fluids per protocol. Basic metabolic panel every 2 hours as noted above. Will follow closely   Tachycardia: Related to #1. Improved. On admission she recieved 2 L of normal saline and vigorous IV fluids. Continuing IV fluids at 7375ml/hr. Repeat EKG this am with improved T wave abnormality and prolonged QT. No CP.    Nausea: May be related to flulike illness complicated by DKA. Improved this am. Eating bites of breakfast.  Influenza panel negative. Will discontinue her Tamiflu.. Provide zofran as needed.  Diabetes mellitus type I: Diagnosed at age 28. She is on an insulin pump at home. See #1.   Depression: Appears stable at baseline. Will continue her home medicaton   Code Status: full Family Communication: husband at bedside Disposition Plan: home hopefully 24-48  hours   Consultants:  none  Procedures:  none  Antibiotics:  none  HPI/Subjective: Sitting up in bed eating bites of breakfast. Reports feeling "better"  Objective: Filed Vitals:   06/24/13 0730  BP:   Pulse:   Temp: 98 F (36.7 C)  Resp:     Intake/Output Summary (Last 24 hours) at 06/24/13 0913 Last data filed at 06/24/13 0700  Gross per 24 hour  Intake 1311.25 ml  Output    450 ml  Net 861.25 ml   Filed Weights   06/23/13 0825 06/23/13 1451 06/24/13 0500  Weight: 63.957 kg (141 lb) 63.8 kg (140 lb 10.5 oz) 65.5 kg (144 lb 6.4 oz)    Exam:   General:  Well nourished in no acute distress  Cardiovascular: S1 and S2. No murmur gallup or rub. No LE edema  Respiratory: normal effort. BS are clear bilaterally to auscultation. i hear no wheeze or rhonchi  Abdomen: flat, soft, non-tender to palpation. +BS throughout  Musculoskeletal: good muscle tone. Joints without swelling/erythema.    Data Reviewed: Basic Metabolic Panel:  Recent Labs Lab 06/23/13 1023  06/23/13 1841 06/23/13 2023 06/23/13 2142 06/24/13 0414 06/24/13 0733  NA 131*  < > 135* 135* 136* 135* 135*  K 6.5*  < > 4.7 4.4 4.6 4.1 4.2  CL 93*  < > 104 105 106 103 103  CO2 14*  < > 19 20 21  18* 19  GLUCOSE 503*  < > 198* 168* 146* 312* 296*  BUN 25*  < > 21 20 20 18 17   CREATININE 0.79  < > 0.76 0.73 0.73 0.67 0.70  CALCIUM 9.3  < >  8.3* 8.2* 8.2* 7.9* 8.0*  MG 2.1  --   --   --   --   --   --   < > = values in this interval not displayed. Liver Function Tests: No results found for this basename: AST, ALT, ALKPHOS, BILITOT, PROT, ALBUMIN,  in the last 168 hours No results found for this basename: LIPASE, AMYLASE,  in the last 168 hours No results found for this basename: AMMONIA,  in the last 168 hours CBC:  Recent Labs Lab 06/23/13 0856 06/24/13 0414  WBC 13.7* 10.6*  NEUTROABS 12.1*  --   HGB 14.2 11.8*  HCT 41.0 34.1*  MCV 89.3 87.9  PLT 262 238   Cardiac Enzymes: No  results found for this basename: CKTOTAL, CKMB, CKMBINDEX, TROPONINI,  in the last 168 hours BNP (last 3 results) No results found for this basename: PROBNP,  in the last 8760 hours CBG:  Recent Labs Lab 06/23/13 2123 06/23/13 2213 06/23/13 2315 06/24/13 0018 06/24/13 0721  GLUCAP 143* 130* 108* 98 277*    Recent Results (from the past 240 hour(s))  MRSA PCR SCREENING     Status: None   Collection Time    06/23/13  2:45 PM      Result Value Ref Range Status   MRSA by PCR NEGATIVE  NEGATIVE Final   Comment:            The GeneXpert MRSA Assay (FDA     approved for NASAL specimens     only), is one component of a     comprehensive MRSA colonization     surveillance program. It is not     intended to diagnose MRSA     infection nor to guide or     monitor treatment for     MRSA infections.     Studies: Dg Chest Portable 1 View  06/23/2013   CLINICAL DATA:  Influenza.  Smoker.  EXAM: PORTABLE CHEST - 1 VIEW  COMPARISON:  No priors.  FINDINGS: Lung volumes are normal. No consolidative airspace disease. No pleural effusions. No pneumothorax. No pulmonary nodule or mass noted. Pulmonary vasculature and the cardiomediastinal silhouette are within normal limits.  IMPRESSION: 1.  No radiographic evidence of acute cardiopulmonary disease.   Electronically Signed   By: Trudie Reed M.D.   On: 06/23/2013 12:21    Scheduled Meds: . enoxaparin (LOVENOX) injection  40 mg Subcutaneous Daily  . insulin aspart  0-20 Units Subcutaneous 6 times per day  . insulin glargine  20 Units Subcutaneous BID  . oseltamivir  75 mg Oral BID  . sertraline  50 mg Oral Daily   Continuous Infusions: . sodium chloride 75 mL/hr at 06/24/13 0700  . insulin (NOVOLIN-R) infusion Stopped (06/24/13 0100)    Principal Problem:   DKA (diabetic ketoacidoses) Active Problems:   Diabetes mellitus type I   Depression   Hyperkalemia   Hyponatremia   Tachycardia   Nausea    Time spent: 30  minutes    [facility] M  [facility] Pager [phone]. If 7PM-7AM, please contact night-coverage at [email or web address], password [facility] 06/24/2013, 9:13 AM  LOS: 1 day

## 2013-06-25 LAB — BASIC METABOLIC PANEL
BUN: 8 mg/dL (ref 6–23)
CHLORIDE: 109 meq/L (ref 96–112)
CO2: 25 mEq/L (ref 19–32)
CREATININE: 0.57 mg/dL (ref 0.50–1.10)
Calcium: 8.5 mg/dL (ref 8.4–10.5)
GFR calc Af Amer: >90 mL/min (ref >90)
GFR calc non Af Amer: >90 mL/min (ref >90)
Glucose, Bld: 152 mg/dL — ABNORMAL HIGH (ref 70–99)
Potassium: 4 mEq/L (ref 3.7–5.3)
Sodium: 141 mEq/L (ref 137–147)

## 2013-06-25 LAB — GLUCOSE, CAPILLARY
GLUCOSE-CAPILLARY: 108 mg/dL — AB (ref 70–99)
Glucose-Capillary: 185 mg/dL — ABNORMAL HIGH (ref 70–99)
Glucose-Capillary: 201 mg/dL — ABNORMAL HIGH (ref 70–99)
Glucose-Capillary: 157 mg/dL — ABNORMAL HIGH (ref 70–99)

## 2013-06-25 MED ORDER — INSULIN PUMP
Freq: Three times a day (TID) | SUBCUTANEOUS | Status: DC
Start: 2013-06-25 — End: 2013-06-25
  Administered 2013-06-25: 1 via SUBCUTANEOUS
  Filled 2013-06-25: qty 1

## 2013-06-25 MED ORDER — INSULIN GLARGINE 100 UNIT/ML ~~LOC~~ SOLN: 32.0000 [IU] | Freq: Two times a day (BID) | SUBCUTANEOUS | Status: DC

## 2013-06-25 NOTE — Discharge Summary (Signed)
The patient was seen and examined. She was discussed with nurse practitioner, Ms. Vedia CofferBlack. Agree with her findings and assessment with additions. The patient is a 28 year old woman with type 1 diabetes mellitus, who was admitted with DKA. She is treated chronically with an insulin pump. The DKA was likely the consequence of having to switch from her insulin pump to Lantus and NovoLog over the previous few weeks due to issues with the company [facility]) providing pump supplies and possibly a short lived viral syndrome. She was treated appropriately during the hospitalization. Her DKA and electrolyte abnormalities resolved. Her husband was able to acquire the insulin pump supplies delivered recently. She was restarted on her insulin pump with settings previously given. The settings were discussed with the diabetes coordinator. Prior to hospital discharge, her blood glucose improved and was 157 at the time of discharge. Her thyroid function was within normal limits. Her hemoglobin A1c was 8.6. She will followup with her endocrinologist Dr. Evlyn KannerSouth for further management.

## 2013-06-25 NOTE — Discharge Summary (Signed)
Physician Discharge Summary  Norma EyeLauren Vavra TKZ:[id number] DOB: 04/22/1986 DOA: 06/23/2013  PCP: Estanislado PandySASSER,PAUL W, MD  Admit date: 06/23/2013 Discharge date: 06/25/2013  Time spent: 40 minutes  Recommendations for Outpatient Follow-up:  1. Dr. Rinaldo CloudSouth's office will call this patient to schedule appointment for 4-7 days. Recommend BMET to evaluate electrolytes.  2. Home on insulin pump per home regimen  Discharge Diagnoses:  Principal Problem:   DKA (diabetic ketoacidoses) Active Problems:  Nausea  Hyperkalemia   Hyponatremia   Tachycardia   Diabetes mellitus type I   Depression      Discharge Condition: stable  Diet recommendation: carb modified  Filed Weights   06/23/13 1451 06/24/13 0500 06/25/13 0500  Weight: 63.8 kg (140 lb 10.5 oz) 65.5 kg (144 lb 6.4 oz) 68.4 kg (150 lb 12.7 oz)    History of present illness:  Norma Allen is a 28 y.o. female with a past medical history that includes type 1 diabetes she is on an insulin pump, depression presented to the emergency department on 06/23/13 with the chief complaint of generalized achiness nausea vomiting. Initial workup in the emergency room revealed the patient is in DKA with an anion gap of 24, serum glucose 511, potassium 6.5. Vital signs were stable but she had a heart rate of 127. She was afebrile and not hypoxic   Information is obtained from the patient. She stated that 2 days prior in the morning she developed sudden nausea. She stated that she did not vomit that day she took some Phenergan and Pepto-Bismol and slept most of the day. Her blood sugar at that time was 133. Later that evening she woke up and her nausea had subsided and she was able to eat dinner. She slept through the night without problem. However yesterday she awakened again with nausea and generalized achiness. She went to her primary care provider who diagnosed her with flu and started her on Tamiflu. She was also instructed that if she began to vomit she should come  to the emergency department. She indicated that her blood sugars were running "high" during this time. The achiness and generalized weakness worsened. She was unable to eat. She began vomiting the evening prior to presentation and the morning of presentation she was no better so she came to the emergency room. She denied any fever chills dysuria hematuria frequency or urgency. She denied diarrhea or constipation. In the emergency department she was given 2 L of normal saline and an insulin drip was initiated. In addition she was given 15 g of Kayexalate, Zofran and Toradol.   Hospital Course:  DKA (diabetic ketoacidoses): Likely related to acute illness in the setting of slight noncompliance due to insulin pump issues. Admitted to stepdown with insulin drip. Anion gap closed to 10 during night and insulin drip discontinued. She quickly improved and insulin pump resumed on 06/25/13. Appreciate diabetes coordinator assistance. Appetite much improved on day of discharge. She is tolerating diet without problem. CBG range on insulin pump 157-200. She will be discharged on insulin pump. Dr. Rinaldo CloudSouth's office contacted and they will call patient for close OP follow up appointment.  Active Problems:  Hyperkalemia: Related to above. She was given 15 g of Kayexalate in the emergency department and level dropped as #1 resolved. repleted and at discharge potassium level 4.0. Recommend BMET at follow up appointment to track electrolytes.    Hyponatremia: Related to #1. IV fluids per protocol. Resolved at discharge.   Tachycardia: Related to #1. Improved. On admission she recieved  2 L of normal saline and vigorous IV fluids. Repeat EKG on 06/24/13 with improved T wave abnormality and prolonged QT. No CP. At discharge HR range 70-90.  Nausea: May be related to flulike illness complicated by DKA. Influenza panel negative. Tamiflu discontinued. Resolved at discharge. Tolerating diet well.    Diabetes mellitus type I:  Diagnosed at age 31. She is on an insulin pump at home. See #1.  Depression: Remained stable at baseline.      Procedures:  none  Consultations:  none  Discharge Exam: Filed Vitals:   06/25/13 1153  BP:   Pulse:   Temp: 97.8 F (36.6 C)  Resp:     General: well nourished appears comfortable Cardiovascular: RRR S1 and S2. No murmur gallup or rub No LE edema Respiratory: normal effort BS clear bilaterally. No wheeze or rhonchi on ausculation.  Discharge Instructions     Medication List    STOP taking these medications       TAMIFLU 75 MG capsule  Generic drug:  oseltamivir      TAKE these medications       fluticasone 50 MCG/ACT nasal spray  Commonly known as:  FLONASE  Place 1 spray into both nostrils daily.     insulin glargine 100 UNIT/ML injection  Commonly known as:  LANTUS  Inject 0.32-0.36 mLs (32-36 Units total) into the skin 2 (two) times daily.     insulin pump Soln  Inject into the skin continuous.     NOVOLOG 100 UNIT/ML injection  Generic drug:  insulin aspart  Inject 3.7-5.2 Units into the skin 3 (three) times daily with meals.     sertraline 50 MG tablet  Commonly known as:  ZOLOFT  Take 50 mg by mouth daily.       No Known Allergies     Follow-up Information   Follow up with Julian Hy, MD. (office will call this afternoon or tomorrow with appointment time. )    Specialty:  Endocrinology   Contact information:   [address] [place] [address] [phone]        The results of significant diagnostics from this hospitalization (including imaging, microbiology, ancillary and laboratory) are listed below for reference.    Significant Diagnostic Studies: Dg Chest Portable 1 View  06/23/2013   CLINICAL DATA:  Influenza.  Smoker.  EXAM: PORTABLE CHEST - 1 VIEW  COMPARISON:  No priors.  FINDINGS: Lung volumes are normal. No consolidative airspace disease. No pleural effusions. No pneumothorax. No pulmonary nodule  or mass noted. Pulmonary vasculature and the cardiomediastinal silhouette are within normal limits.  IMPRESSION: 1.  No radiographic evidence of acute cardiopulmonary disease.   Electronically Signed   By: Trudie Reed M.D.   On: 06/23/2013 12:21    Microbiology: Recent Results (from the past 240 hour(s))  MRSA PCR SCREENING     Status: None   Collection Time    06/23/13  2:45 PM      Result Value Ref Range Status   MRSA by PCR NEGATIVE  NEGATIVE Final   Comment:            The GeneXpert MRSA Assay (FDA     approved for NASAL specimens     only), is one component of a     comprehensive MRSA colonization     surveillance program. It is not     intended to diagnose MRSA     infection nor to guide or     monitor treatment for  MRSA infections.     Labs: Basic Metabolic Panel:  Recent Labs Lab 06/23/13 1023  06/24/13 0733 06/24/13 0946 06/24/13 1115 06/24/13 1334 06/25/13 0414  NA 131*  < > 135* 136* 135* 139 141  K 6.5*  < > 4.2 3.5* 3.8 3.8 4.0  CL 93*  < > 103 104 104 107 109  CO2 14*  < > 19 22 22 22 25   GLUCOSE 503*  < > 296* 259* 230* 211* 152*  BUN 25*  < > 17 16 17 15 8   CREATININE 0.79  < > 0.70 0.70 0.66 0.64 0.57  CALCIUM 9.3  < > 8.0* 8.6 8.4 8.2* 8.5  MG 2.1  --   --   --   --   --   --   < > = values in this interval not displayed. Liver Function Tests: No results found for this basename: AST, ALT, ALKPHOS, BILITOT, PROT, ALBUMIN,  in the last 168 hours No results found for this basename: LIPASE, AMYLASE,  in the last 168 hours No results found for this basename: AMMONIA,  in the last 168 hours CBC:  Recent Labs Lab 06/23/13 0856 06/24/13 0414  WBC 13.7* 10.6*  NEUTROABS 12.1*  --   HGB 14.2 11.8*  HCT 41.0 34.1*  MCV 89.3 87.9  PLT 262 238   Cardiac Enzymes: No results found for this basename: CKTOTAL, CKMB, CKMBINDEX, TROPONINI,  in the last 168 hours BNP: BNP (last 3 results) No results found for this basename: PROBNP,  in the last  8760 hours CBG:  Recent Labs Lab 06/24/13 1753 06/24/13 2116 06/25/13 0725 06/25/13 1134 06/25/13 1204  GLUCAP 140* 161* 108* 201* 185*       Signed:  Shalee Paolo M  [facility] 06/25/2013, 1:48 PM

## 2013-06-25 NOTE — Progress Notes (Signed)
DISCHARGE INSTRUCTION GIVEN PT ALERT AND ORIENTED. SKIN WARM AND DRY. DENIES ANY FURTHER NAUSEA. INSULIN PUMP IN PLACE AND FUNCTIONING PROPERLY.

## 2013-06-25 NOTE — ED Provider Notes (Signed)
Medical screening examination/treatment/procedure(s) were performed by non-physician practitioner and as supervising physician I was immediately available for consultation/collaboration.   EKG Interpretation   Date/Time:  Tuesday June 23 2013 11:30:14 EST Ventricular Rate:  128 PR Interval:  140 QRS Duration: 88 QT Interval:  316 QTC Calculation: 461 R Axis:   77 Text Interpretation:  Sinus tachycardia Nonspecific ST abnormality  Abnormal QRS-T angle, consider primary T wave abnormality Abnormal ECG No  previous ECGs available Confirmed by Alailah Safley  MD, Pamelia Botto ([id number]) on  06/23/2013 12:10:09 PM        Benny LennertJoseph L Terrah Decoster, MD 06/25/13 [phone]

## 2013-06-25 NOTE — Progress Notes (Signed)
PT AND RN REVIEWED PT CONTRACT AND FLOW SHEET FOR HER TO BEGIN USING HER OWN INSULIN PUMP[ NOW.

## 2014-03-23 LAB — OB RESULTS CONSOLE RPR: RPR: NONREACTIVE

## 2014-03-23 LAB — OB RESULTS CONSOLE GC/CHLAMYDIA
Chlamydia: NEGATIVE
GC PROBE AMP, GENITAL: NEGATIVE

## 2014-03-23 LAB — OB RESULTS CONSOLE RUBELLA ANTIBODY, IGM: Rubella: IMMUNE

## 2014-03-23 LAB — OB RESULTS CONSOLE HEPATITIS B SURFACE ANTIGEN: Hepatitis B Surface Ag: NEGATIVE

## 2014-03-23 LAB — OB RESULTS CONSOLE ABO/RH: RH TYPE: POSITIVE

## 2014-03-23 LAB — OB RESULTS CONSOLE HIV ANTIBODY (ROUTINE TESTING): HIV: NONREACTIVE

## 2014-03-23 LAB — OB RESULTS CONSOLE ANTIBODY SCREEN: Antibody Screen: NEGATIVE

## 2014-04-23 NOTE — L&D Delivery Note (Signed)
Delivery Note At 6:40 AM a viable and healthy female was delivered via Vaginal, Spontaneous Delivery (Presentation: Right Occiput Anterior).  APGAR: 7,9 ; weight 8#10 .   Placenta status: delivered, intact .  Cord: 3VC with the following complications: nuchal x 1  Anesthesia: Epidural  Episiotomy: None Lacerations:  2nd degree perineal, introital lac (labial) Suture Repair: 3.0 vicryl rapide Est. Blood Loss (mL):  400cc  Mom to postpartum.  Baby to Couplet care / Skin to Skin.  Bovard-Stuckert, Norma Allen 10/08/2014, 7:08 AM  Br/RI/Tdap in PNC/O+/POPs

## 2014-10-06 ENCOUNTER — Telehealth (HOSPITAL_COMMUNITY): Payer: Self-pay | Admitting: *Deleted

## 2014-10-06 ENCOUNTER — Encounter (HOSPITAL_COMMUNITY): Payer: Self-pay | Admitting: *Deleted

## 2014-10-06 LAB — OB RESULTS CONSOLE GBS: STREP GROUP B AG: NEGATIVE

## 2014-10-06 NOTE — Telephone Encounter (Signed)
Preadmission screen  

## 2014-10-07 ENCOUNTER — Inpatient Hospital Stay (HOSPITAL_COMMUNITY): Payer: BC Managed Care – PPO | Admitting: Anesthesiology

## 2014-10-07 ENCOUNTER — Encounter (HOSPITAL_COMMUNITY): Payer: Self-pay | Admitting: *Deleted

## 2014-10-07 ENCOUNTER — Inpatient Hospital Stay (HOSPITAL_COMMUNITY)
Admission: AD | Admit: 2014-10-07 | Discharge: 2014-10-10 | DRG: 774 | Disposition: A | Discharge status: Home / Self Care | Payer: BC Managed Care – PPO | Source: Ambulatory Visit | Attending: Obstetrics and Gynecology | Admitting: Obstetrics and Gynecology

## 2014-10-07 DIAGNOSIS — Z833 Family history of diabetes mellitus: Secondary | ICD-10-CM

## 2014-10-07 DIAGNOSIS — Z794 Long term (current) use of insulin: Secondary | ICD-10-CM

## 2014-10-07 DIAGNOSIS — Z87891 Personal history of nicotine dependence: Secondary | ICD-10-CM

## 2014-10-07 DIAGNOSIS — Z3A37 37 weeks gestation of pregnancy: Secondary | ICD-10-CM | POA: Diagnosis present

## 2014-10-07 DIAGNOSIS — O1493 Unspecified pre-eclampsia, third trimester: Secondary | ICD-10-CM | POA: Diagnosis present

## 2014-10-07 DIAGNOSIS — O2402 Pre-existing diabetes mellitus, type 1, in childbirth: Secondary | ICD-10-CM | POA: Diagnosis present

## 2014-10-07 DIAGNOSIS — O1490 Unspecified pre-eclampsia, unspecified trimester: Secondary | ICD-10-CM | POA: Diagnosis present

## 2014-10-07 DIAGNOSIS — E109 Type 1 diabetes mellitus without complications: Secondary | ICD-10-CM | POA: Diagnosis present

## 2014-10-07 DIAGNOSIS — Z8249 Family history of ischemic heart disease and other diseases of the circulatory system: Secondary | ICD-10-CM | POA: Diagnosis not present

## 2014-10-07 LAB — CBC
HEMATOCRIT: 35.5 % — AB (ref 36.0–46.0)
HEMATOCRIT: 35 % — AB (ref 36.0–46.0)
HEMOGLOBIN: 12.2 g/dL (ref 12.0–15.0)
HEMOGLOBIN: 11.9 g/dL — AB (ref 12.0–15.0)
MCH: 27.9 pg (ref 26.0–34.0)
MCH: 27.5 pg (ref 26.0–34.0)
MCHC: 34.4 g/dL (ref 30.0–36.0)
MCHC: 34 g/dL (ref 30.0–36.0)
MCV: 81.1 fL (ref 78.0–100.0)
MCV: 81 fL (ref 78.0–100.0)
PLATELETS: 210 10*3/uL (ref 150–400)
Platelets: 201 10*3/uL (ref 150–400)
RBC: 4.38 MIL/uL (ref 3.87–5.11)
RBC: 4.32 MIL/uL (ref 3.87–5.11)
RDW: 12.5 % (ref 11.5–15.5)
RDW: 12.4 % (ref 11.5–15.5)
WBC: 9 10*3/uL (ref 4.0–10.5)
WBC: 9.8 10*3/uL (ref 4.0–10.5)

## 2014-10-07 LAB — PROTEIN / CREATININE RATIO, URINE
Creatinine, Urine: 100 mg/dL
PROTEIN CREATININE RATIO: 0.69 mg/mg{creat} — AB (ref 0.00–0.15)
TOTAL PROTEIN, URINE: 69 mg/dL

## 2014-10-07 LAB — URINALYSIS, ROUTINE W REFLEX MICROSCOPIC
Bilirubin Urine: NEGATIVE
Glucose, UA: >1000 mg/dL — AB
Hgb urine dipstick: NEGATIVE
KETONES UR: NEGATIVE mg/dL
LEUKOCYTES UA: NEGATIVE
NITRITE: NEGATIVE
PH: 7 (ref 5.0–8.0)
Protein, ur: 30 mg/dL — AB
Specific Gravity, Urine: 1.015 (ref 1.005–1.030)
UROBILINOGEN UA: 1 mg/dL (ref 0.0–1.0)

## 2014-10-07 LAB — GLUCOSE, CAPILLARY
GLUCOSE-CAPILLARY: 57 mg/dL — AB (ref 65–99)
GLUCOSE-CAPILLARY: 81 mg/dL (ref 65–99)
GLUCOSE-CAPILLARY: 91 mg/dL (ref 65–99)
Glucose-Capillary: 134 mg/dL — ABNORMAL HIGH (ref 65–99)
Glucose-Capillary: 153 mg/dL — ABNORMAL HIGH (ref 65–99)
Glucose-Capillary: 49 mg/dL — ABNORMAL LOW (ref 65–99)
Glucose-Capillary: 62 mg/dL — ABNORMAL LOW (ref 65–99)
Glucose-Capillary: 63 mg/dL — ABNORMAL LOW (ref 65–99)
Glucose-Capillary: 65 mg/dL (ref 65–99)
Glucose-Capillary: 73 mg/dL (ref 65–99)
Glucose-Capillary: 93 mg/dL (ref 65–99)

## 2014-10-07 LAB — COMPREHENSIVE METABOLIC PANEL
ALBUMIN: 2.8 g/dL — AB (ref 3.5–5.0)
ALT: 12 U/L — ABNORMAL LOW (ref 14–54)
AST: 23 U/L (ref 15–41)
Alkaline Phosphatase: 174 U/L — ABNORMAL HIGH (ref 38–126)
Anion gap: 8 (ref 5–15)
BUN: 16 mg/dL (ref 6–20)
CO2: 21 mmol/L — ABNORMAL LOW (ref 22–32)
Calcium: 8.8 mg/dL — ABNORMAL LOW (ref 8.9–10.3)
Chloride: 106 mmol/L (ref 101–111)
Creatinine, Ser: 0.56 mg/dL (ref 0.44–1.00)
GFR calc non Af Amer: >60 mL/min (ref >60)
Glucose, Bld: 198 mg/dL — ABNORMAL HIGH (ref 65–99)
Potassium: 4 mmol/L (ref 3.5–5.1)
Sodium: 135 mmol/L (ref 135–145)
TOTAL PROTEIN: 6.6 g/dL (ref 6.5–8.1)
Total Bilirubin: 0.5 mg/dL (ref 0.3–1.2)

## 2014-10-07 LAB — TYPE AND SCREEN
ABO/RH(D): O POS
Antibody Screen: NEGATIVE

## 2014-10-07 LAB — URINE MICROSCOPIC-ADD ON

## 2014-10-07 LAB — AMNISURE RUPTURE OF MEMBRANE (ROM) NOT AT ARMC: AMNISURE: POSITIVE

## 2014-10-07 LAB — ABO/RH: ABO/RH(D): O POS

## 2014-10-07 MED ORDER — LACTATED RINGERS IV SOLN
500.0000 mL | INTRAVENOUS | Status: DC | PRN
  Administered 2014-10-07: 1000 mL via INTRAVENOUS

## 2014-10-07 MED ORDER — BUTORPHANOL TARTRATE 1 MG/ML IJ SOLN: 1.0000 mg | INTRAMUSCULAR | Status: DC | PRN

## 2014-10-07 MED ORDER — HYDRALAZINE HCL 20 MG/ML IJ SOLN: 10.0000 mg | Freq: Once | INTRAMUSCULAR | Status: AC | PRN

## 2014-10-07 MED ORDER — OXYTOCIN 40 UNITS IN LACTATED RINGERS INFUSION - SIMPLE MED: 62.5000 mL/h | INTRAVENOUS | Status: DC

## 2014-10-07 MED ORDER — OXYTOCIN BOLUS FROM INFUSION
500.0000 mL | INTRAVENOUS | Status: DC
  Administered 2014-10-08: 500 mL via INTRAVENOUS

## 2014-10-07 MED ORDER — LACTATED RINGERS IV SOLN
INTRAVENOUS | Status: DC
  Administered 2014-10-07: 11:00:00 via INTRAVENOUS
  Administered 2014-10-07: 125 mL/h via INTRAVENOUS
  Administered 2014-10-07: 22:00:00 via INTRAVENOUS

## 2014-10-07 MED ORDER — TERBUTALINE SULFATE 1 MG/ML IJ SOLN: 0.2500 mg | Freq: Once | INTRAMUSCULAR | Status: AC | PRN

## 2014-10-07 MED ORDER — DEXTROSE 50 % IV SOLN
INTRAVENOUS | Status: AC
  Administered 2014-10-07: 20 mL
  Filled 2014-10-07: qty 50

## 2014-10-07 MED ORDER — SODIUM CHLORIDE 0.9 % IV SOLN
INTRAVENOUS | Status: DC
  Administered 2014-10-07: 1.4 [IU]/h via INTRAVENOUS
  Filled 2014-10-07: qty 2.5

## 2014-10-07 MED ORDER — FENTANYL 2.5 MCG/ML BUPIVACAINE 1/10 % EPIDURAL INFUSION (WH - ANES): 14.0000 mL/h | INTRAMUSCULAR | Status: DC | PRN

## 2014-10-07 MED ORDER — CITRIC ACID-SODIUM CITRATE 334-500 MG/5ML PO SOLN: 30.0000 mL | ORAL | Status: DC | PRN

## 2014-10-07 MED ORDER — DIPHENHYDRAMINE HCL 50 MG/ML IJ SOLN: 12.5000 mg | INTRAMUSCULAR | Status: DC | PRN

## 2014-10-07 MED ORDER — OXYTOCIN 40 UNITS IN LACTATED RINGERS INFUSION - SIMPLE MED
1.0000 m[IU]/min | INTRAVENOUS | Status: DC
  Administered 2014-10-07: 2 m[IU]/min via INTRAVENOUS
  Filled 2014-10-07: qty 1000

## 2014-10-07 MED ORDER — LIDOCAINE HCL (PF) 1 % IJ SOLN
30.0000 mL | INTRAMUSCULAR | Status: AC | PRN
  Administered 2014-10-08: 30 mL via SUBCUTANEOUS
  Filled 2014-10-07: qty 30

## 2014-10-07 MED ORDER — FENTANYL 2.5 MCG/ML BUPIVACAINE 1/10 % EPIDURAL INFUSION (WH - ANES)
14.0000 mL/h | INTRAMUSCULAR | Status: DC | PRN
  Administered 2014-10-07 – 2014-10-08 (×2): 14 mL/h via EPIDURAL
  Filled 2014-10-07 (×2): qty 125

## 2014-10-07 MED ORDER — LIDOCAINE HCL (PF) 1 % IJ SOLN
INTRAMUSCULAR | Status: DC | PRN
  Administered 2014-10-07: 4 mL
  Administered 2014-10-07: 6 mL

## 2014-10-07 MED ORDER — ONDANSETRON HCL 4 MG/2ML IJ SOLN: 4.0000 mg | Freq: Four times a day (QID) | INTRAMUSCULAR | Status: DC | PRN

## 2014-10-07 MED ORDER — FLEET ENEMA 7-19 GM/118ML RE ENEM: 1.0000 | ENEMA | RECTAL | Status: DC | PRN

## 2014-10-07 MED ORDER — EPHEDRINE 5 MG/ML INJ
10.0000 mg | INTRAVENOUS | Status: DC | PRN
  Filled 2014-10-07: qty 2

## 2014-10-07 MED ORDER — OXYCODONE-ACETAMINOPHEN 5-325 MG PO TABS: 2.0000 | ORAL_TABLET | ORAL | Status: DC | PRN

## 2014-10-07 MED ORDER — LABETALOL HCL 5 MG/ML IV SOLN
20.0000 mg | INTRAVENOUS | Status: DC | PRN
Start: 2014-10-07 — End: 2014-10-08

## 2014-10-07 MED ORDER — OXYCODONE-ACETAMINOPHEN 5-325 MG PO TABS: 1.0000 | ORAL_TABLET | ORAL | Status: DC | PRN

## 2014-10-07 MED ORDER — PHENYLEPHRINE 40 MCG/ML (10ML) SYRINGE FOR IV PUSH (FOR BLOOD PRESSURE SUPPORT)
80.0000 ug | PREFILLED_SYRINGE | INTRAVENOUS | Status: DC | PRN
  Filled 2014-10-07: qty 20
  Filled 2014-10-07: qty 2

## 2014-10-07 MED ORDER — ACETAMINOPHEN 325 MG PO TABS: 650.0000 mg | ORAL_TABLET | ORAL | Status: DC | PRN

## 2014-10-07 NOTE — Progress Notes (Signed)
cbg 66 pt requested apple juice

## 2014-10-07 NOTE — Progress Notes (Signed)
Patient ID: Norma Allen, female   DOB: 05-12-1985, 29 y.o.   MRN: [id number]  forebag ruptured, clear fluid.  Comfortable with epidural  AFVSS Sugars controlled by glucommander 150's, good variability, category 1 toco q 3-105min  Continue IOL

## 2014-10-07 NOTE — Progress Notes (Signed)
Patient ID: Norma Allen, female   DOB: Jun 29, 1985, 29 y.o.   MRN: [id number]  Comfortable with epidural  AFVSS, some elevated BP, none high enough to be treated, FSBS, some lows on Glucommander gen NAD FHTs 140, mod var, category 1 toco Q 2-4  IUPC placed, w/o diff/comp  SVE 5.5  Continue current mgmt - IOL.

## 2014-10-07 NOTE — Plan of Care (Signed)
Hypoglycemic Event  CBG: 63  Treatment: 15 GM carbohydrate snack  Symptoms: Shaky  Follow-up CBG: Time:2130 CBG Result:65  Possible Reasons for Event: Inadequate meal intake  Comments/MD notified: MD notified; told to drink apple juice and have jello    Norma Allen E  Remember to initiate Hypoglycemia Order Set & complete

## 2014-10-07 NOTE — Anesthesia Preprocedure Evaluation (Signed)
Anesthesia Evaluation  Patient identified by MRN, date of birth, ID band Patient awake    Reviewed: Allergy & Precautions, H&P , Patient's Chart, lab work & pertinent test results  Airway Mallampati: II  TM Distance: >3 FB Neck ROM: full    Dental  (+) Teeth Intact   Pulmonary former smoker,  breath sounds clear to auscultation        Cardiovascular hypertension, Rhythm:regular Rate:Normal     Neuro/Psych    GI/Hepatic   Endo/Other  diabetes, Type 1, Insulin Dependent  Renal/GU      Musculoskeletal   Abdominal   Peds  Hematology   Anesthesia Other Findings       Reproductive/Obstetrics (+) Pregnancy                             Anesthesia Physical Anesthesia Plan  ASA: III  Anesthesia Plan: Epidural   Post-op Pain Management:    Induction:   Airway Management Planned:   Additional Equipment:   Intra-op Plan:   Post-operative Plan:   Informed Consent: I have reviewed the patients History and Physical, chart, labs and discussed the procedure including the risks, benefits and alternatives for the proposed anesthesia with the patient or authorized representative who has indicated his/her understanding and acceptance.   Dental Advisory Given  Plan Discussed with:   Anesthesia Plan Comments: (Labs checked- platelets confirmed with RN in room. Fetal heart tracing, per RN, reported to be stable enough for sitting procedure. Discussed epidural, and patient consents to the procedure:  included risk of possible headache,backache, failed block, allergic reaction, and nerve injury. This patient was asked if she had any questions or concerns before the procedure started.)        Anesthesia Quick Evaluation

## 2014-10-07 NOTE — H&P (Signed)
Norma Allen is a 29 y.o. female G1P0 at 47+ with PreE and DM1.  Called with ? ROM.  +FM, ? LOF, no VB, occ ctx.  Sugars initially well-controlled, recent HgbA1c = 8.4.  Had planned IOL at 38 wk, for ? Control.  PreE diagnosed w/ elevated BP, and protein/creatinine ratio = .6  . Maternal Medical History:  Reason for admission: Contractions.   Contractions: Frequency: irregular.    Fetal activity: Perceived fetal activity is normal.    Prenatal complications: Pre-eclampsia.   Prenatal Complications - Diabetes: type 1. Diabetes is managed by intensive insulin program.      OB History    Gravida Para Term Preterm AB TAB SAB Ectopic Multiple Living   1         0    G1 present No abn pap No STD  Past Medical History  Diagnosis Date  . Diabetes mellitus type I   . DKA (diabetic ketoacidoses)   . Hx of varicella   . Depression     meds 3 years ago   Past Surgical History  Procedure Laterality Date  . Wisdom tooth extraction     Family History: family history includes Cancer in her mother; Diabetes in her father; Hypertension in her father and mother. Social History:  reports that she quit smoking about 7 months ago. Her smoking use included Cigarettes. She has a 2 pack-year smoking history. She has never used smokeless tobacco. She reports that she does not drink alcohol or use illicit drugs.married, teacher Meds insulin pump, PNV All NKDA   Prenatal Transfer Tool  Maternal Diabetes: Yes:  Diabetes Type:  Pre-pregnancy Genetic Screening: Normal Maternal Ultrasounds/Referrals: Normal Fetal Ultrasounds or other Referrals:  Fetal echo normal Maternal Substance Abuse:  Yes:  Type: Smoker Significant Maternal Medications:  Meds include: Other: Insulin Significant Maternal Lab Results:  Lab values include: Group B Strep negative Other Comments:  Type 1 Diabetic  Review of Systems  Constitutional: Negative.   HENT: Negative.   Eyes: Negative.   Respiratory: Negative.    Cardiovascular: Negative.   Gastrointestinal: Negative.   Genitourinary: Negative.   Musculoskeletal: Negative.   Skin: Negative.   Neurological: Negative.   Psychiatric/Behavioral: Negative.     Dilation: 2.5 Effacement (%): 60 Station: -2, -1 Exam by:: Dherr rn Blood pressure 141/78, pulse 88, temperature 98.2 F (36.8 C), resp. rate 18, height 5\' 6"  (1.676 m), weight 79.652 kg (175 lb 9.6 oz). Maternal Exam:  Uterine Assessment: Contraction strength is moderate.  Contraction frequency is irregular.   Abdomen: Patient reports no abdominal tenderness. Fundal height is appropriate for gestation.   Estimated fetal weight is 7-8#.   Fetal presentation: vertex  Introitus: Normal vulva. Normal vagina.  Cervix: Cervix evaluated by digital exam.     Physical Exam  Constitutional: She is oriented to person, place, and time. She appears well-developed and well-nourished.  HENT:  Head: Normocephalic and atraumatic.  Cardiovascular: Normal rate and regular rhythm.   Respiratory: Effort normal and breath sounds normal. No respiratory distress. She has no wheezes.  GI: Soft. Bowel sounds are normal. She exhibits no distension. There is no tenderness.  Musculoskeletal: Normal range of motion.  Neurological: She is alert and oriented to person, place, and time.  Skin: Skin is warm and dry.  Psychiatric: She has a normal mood and affect. Her behavior is normal.    Prenatal labs: ABO, Rh: --/--/O POS, O POS (06/16 1105) Antibody: NEG (06/16 1105) Rubella: Immune (12/01 0000) RPR: Nonreactive (12/01  0000)  HBsAg: Negative (12/01 0000)  HIV: Non-reactive (12/01 0000)  GBS: Negative (06/15 0000)    Tdap 07/27/14, CF neg Hgb 12.8/Plt 286/Ur Cx neg/CHL neg/ GC neg/First Tri and AFP WNL  Growth followed  Several BPP - 8/8 Nl NT on [place] Nl anat Assessment/Plan: 28yo G1 at 37 wk, IOL given DM1 and PreE Glucomander GBBS neg Pitocin and AROM for IOL Expect SVD    Bovard-Stuckert,  Douglas Smolinsky 10/07/2014, 1:07 PM

## 2014-10-07 NOTE — MAU Provider Note (Signed)
History     CSN: [id number]  Arrival date and time: 10/07/14 4098   None     Chief Complaint  Patient presents with  . Rupture of Membranes   HPI [name] is a 29 y.o. G1P0 at [redacted]w[redacted]d who presents to MAU today with complaint of LOF. The patient was found to have elevated BP upon arrival in MAU. Patient denies history of HTN. She denies headache, blurred vision, abdominal pain today. She does endorse mild LE edema bilaterally. She reports LOF since 0730 today. She denies vaginal bleeding. She states frequent mild contractions. She reports normal fetal movement.   OB History    Gravida Para Term Preterm AB TAB SAB Ectopic Multiple Living   1         0      Past Medical History  Diagnosis Date  . Diabetes mellitus type I   . DKA (diabetic ketoacidoses)   . Hx of varicella   . Depression     meds 3 years ago    Past Surgical History  Procedure Laterality Date  . Wisdom tooth extraction      Family History  Problem Relation Age of Onset  . Cancer Mother   . Hypertension Mother   . Diabetes Father   . Hypertension Father     History  Substance Use Topics  . Smoking status: Former Smoker -- 0.50 packs/day for 4 years    Types: Cigarettes    Quit date: 03/07/2014  . Smokeless tobacco: Never Used  . Alcohol Use: No    Allergies: No Known Allergies  Prescriptions prior to admission  Medication Sig Dispense Refill Last Dose  . insulin detemir (LEVEMIR) 100 UNIT/ML injection Inject 43 Units into the skin at bedtime.   10/06/2014 at Unknown time  . NOVOLOG 100 UNIT/ML injection Inject 3.7-5.2 Units into the skin 3 (three) times daily with meals.    10/07/2014 at Unknown time  . Prenatal Vit-Fe Fumarate-FA (PRENATAL MULTIVITAMIN) TABS tablet Take 1 tablet by mouth daily at 12 noon.   Past Week at Unknown time    Review of Systems  Constitutional: Positive for malaise/fatigue. Negative for fever.  Eyes: Negative for blurred vision.  Gastrointestinal: Negative  for abdominal pain.  Genitourinary:       Neg - vaginal bleeding + LOF  Neurological: Negative for headaches.   Physical Exam   Blood pressure 145/92, pulse 99, temperature 98.2 F (36.8 C), resp. rate 18, height  (1.676 m), weight 175 lb 9.6 oz (79.652 kg).  Physical Exam  Nursing note and vitals reviewed. Constitutional: She is oriented to person, place, and time. She appears well-developed and well-nourished. No distress.  HENT:  Head: Normocephalic and atraumatic.  Cardiovascular: Normal rate.   Respiratory: Effort normal.  GI: Soft. She exhibits no distension and no mass. There is no tenderness. There is no rebound and no guarding.  Musculoskeletal: She exhibits edema (mild non-pitting edema to the mid-shin bilaterally).  Neurological: She is alert and oriented to person, place, and time. She has normal reflexes.  No clonus  Skin: Skin is warm and dry. No erythema.  Psychiatric: She has a normal mood and affect.   Results for orders placed or performed during the hospital encounter of 10/07/14 (from the past 24 hour(s))  Urinalysis, Routine w reflex microscopic (not at [facility])     Status: Abnormal   Collection Time: 10/07/14  9:45 AM  Result Value Ref Range   Color, Urine YELLOW YELLOW  APPearance CLEAR CLEAR   Specific Gravity, Urine 1.015 1.005 - 1.030   pH 7.0 5.0 - 8.0   Glucose, UA >1000 (A) NEGATIVE mg/dL   Hgb urine dipstick NEGATIVE NEGATIVE   Bilirubin Urine NEGATIVE NEGATIVE   Ketones, ur NEGATIVE NEGATIVE mg/dL   Protein, ur 30 (A) NEGATIVE mg/dL   Urobilinogen, UA 1.0 0.0 - 1.0 mg/dL   Nitrite NEGATIVE NEGATIVE   Leukocytes, UA NEGATIVE NEGATIVE  Protein / creatinine ratio, urine     Status: Abnormal   Collection Time: 10/07/14  9:45 AM  Result Value Ref Range   Creatinine, Urine 100.00 mg/dL   Total Protein, Urine 69 mg/dL   Protein Creatinine Ratio 0.69 (H) 0.00 - 0.15 mg/mg[Cre]  Urine microscopic-add on     Status: Abnormal   Collection Time:  10/07/14  9:45 AM  Result Value Ref Range   Squamous Epithelial / LPF FEW (A) RARE   WBC, UA 0-2 <3 WBC/hpf   Bacteria, UA RARE RARE  Amnisure rupture of membrane (rom)not at [facility]     Status: None   Collection Time: 10/07/14 10:31 AM  Result Value Ref Range   Amnisure ROM POSITIVE    Fetal Monitoring: Baseline: 140 bpm, moderate variability, + accelerations, no decelerations Contractions: q 7 minutes  MAU Course  Procedures None  MDM Discussed patient with Dr. Ellyn Hack. She agrees with plan for admission to L&D for labor and pre-eclampsia  Assessment and Plan  A: [facility] at [redacted]w[redacted]d SROM  P: Admit to L&D Orders entered in Epic  Marny Lowenstein, PA-C  10/07/2014, 11:09 AM

## 2014-10-07 NOTE — Progress Notes (Signed)
Glucose was 70. Meter not tranferring data to computer.

## 2014-10-07 NOTE — Anesthesia Procedure Notes (Signed)
Epidural Patient location during procedure: OB  Preanesthetic Checklist Completed: patient identified, site marked, surgical consent, pre-op evaluation, timeout performed, IV checked, risks and benefits discussed and monitors and equipment checked  Epidural Patient position: sitting Prep: site prepped and draped and DuraPrep Patient monitoring: continuous pulse ox and blood pressure Approach: midline Location: L3-L4 Injection technique: LOR air  Needle:  Needle type: Tuohy  Needle gauge: 17 G Needle length: 9 cm and 9 Needle insertion depth: 4 cm Catheter type: closed end flexible Catheter size: 19 Gauge Catheter at skin depth: 10 cm Test dose: negative  Assessment Events: blood not aspirated, injection not painful, no injection resistance, negative IV test and no paresthesia  Additional Notes Dosing of Epidural:  1st dose, through catheter .............................................  Xylocaine 40 mg  2nd dose, through catheter, after waiting 3 minutes.........Xylocaine 60 mg    ( 1% Xylo charted as a single dose in Epic Meds for ease of charting; actual dosing was fractionated as above, for saftey's sake)  As each dose occurred, patient was free of IV sx; and patient exhibited no evidence of SA injection.  Patient is more comfortable after epidural dosed. Please see RN's note for documentation of vital signs,and FHR which are stable.  Patient reminded not to try to ambulate with numb legs, and that an RN must be present when she attempts to get up.       

## 2014-10-07 NOTE — Plan of Care (Signed)
Hypoglycemic Event  CBG: 62  Treatment: 15 GM carbohydrate snack  Symptoms: None  Follow-up CBG: Time:1817 CBG Result:81  Possible Reasons for Event: Medication regimen: insulin drip  Comments/MD notified:MD not notified at this time    Norma Allen, [name]  Remember to initiate Hypoglycemia Order Set & complete

## 2014-10-07 NOTE — MAU Note (Signed)
Pt reprots she had wet panties around 7:30 this morning and ia wearing a pad now. C/o some mild "tightening. " Good fetal movement reported.

## 2014-10-07 NOTE — Progress Notes (Signed)
Hypoglycemic Event  CBG:57  Treatment: 15 GM carbohydrate snack  Symptoms: Sweaty  Follow-up CBG: Time:1756 CBG Result:62  Possible Reasons for Event: Medication regimen: insulin drip  Comments/MD notified: Bovard aware    Kacelyn Rowzee, [name]  Remember to initiate Hypoglycemia Order Set & complete

## 2014-10-08 ENCOUNTER — Encounter (HOSPITAL_COMMUNITY): Payer: Self-pay | Admitting: Obstetrics and Gynecology

## 2014-10-08 LAB — CBC
HCT: 34.8 % — ABNORMAL LOW (ref 36.0–46.0)
Hemoglobin: 11.7 g/dL — ABNORMAL LOW (ref 12.0–15.0)
MCH: 27.2 pg (ref 26.0–34.0)
MCHC: 33.6 g/dL (ref 30.0–36.0)
MCV: 80.9 fL (ref 78.0–100.0)
PLATELETS: 198 10*3/uL (ref 150–400)
RBC: 4.3 MIL/uL (ref 3.87–5.11)
RDW: 12.5 % (ref 11.5–15.5)
WBC: 15.4 10*3/uL — AB (ref 4.0–10.5)

## 2014-10-08 LAB — GLUCOSE, CAPILLARY
GLUCOSE-CAPILLARY: 76 mg/dL (ref 65–99)
GLUCOSE-CAPILLARY: 54 mg/dL — AB (ref 65–99)
GLUCOSE-CAPILLARY: 80 mg/dL (ref 65–99)
GLUCOSE-CAPILLARY: 173 mg/dL — AB (ref 65–99)
Glucose-Capillary: 109 mg/dL — ABNORMAL HIGH (ref 65–99)
Glucose-Capillary: 234 mg/dL — ABNORMAL HIGH (ref 65–99)
Glucose-Capillary: 53 mg/dL — ABNORMAL LOW (ref 65–99)
Glucose-Capillary: 53 mg/dL — ABNORMAL LOW (ref 65–99)
Glucose-Capillary: 68 mg/dL (ref 65–99)
Glucose-Capillary: 92 mg/dL (ref 65–99)

## 2014-10-08 LAB — RPR: RPR Ser Ql: NONREACTIVE

## 2014-10-08 LAB — HIV ANTIBODY (ROUTINE TESTING W REFLEX): HIV Screen 4th Generation wRfx: NONREACTIVE

## 2014-10-08 MED ORDER — ZOLPIDEM TARTRATE 5 MG PO TABS: 5.0000 mg | ORAL_TABLET | Freq: Every evening | ORAL | Status: DC | PRN

## 2014-10-08 MED ORDER — OXYTOCIN 10 UNIT/ML IJ SOLN
INTRAMUSCULAR | Status: AC
  Filled 2014-10-08: qty 1

## 2014-10-08 MED ORDER — DEXTROSE 50 % IV SOLN
INTRAVENOUS | Status: AC
  Filled 2014-10-08: qty 50

## 2014-10-08 MED ORDER — ACETAMINOPHEN 325 MG PO TABS
650.0000 mg | ORAL_TABLET | ORAL | Status: DC | PRN
  Administered 2014-10-09: 650 mg via ORAL
  Filled 2014-10-08: qty 2

## 2014-10-08 MED ORDER — OXYCODONE-ACETAMINOPHEN 5-325 MG PO TABS
1.0000 | ORAL_TABLET | ORAL | Status: DC | PRN
  Administered 2014-10-10: 1 via ORAL
  Filled 2014-10-08: qty 1

## 2014-10-08 MED ORDER — OXYTOCIN 10 UNIT/ML IJ SOLN
INTRAMUSCULAR | Status: AC
  Administered 2014-10-08: 10 [IU]
  Filled 2014-10-08: qty 1

## 2014-10-08 MED ORDER — IBUPROFEN 600 MG PO TABS
600.0000 mg | ORAL_TABLET | Freq: Four times a day (QID) | ORAL | Status: DC
Start: 2014-10-08 — End: 2014-10-10
  Administered 2014-10-08 – 2014-10-10 (×10): 600 mg via ORAL
  Filled 2014-10-08 (×10): qty 1

## 2014-10-08 MED ORDER — SIMETHICONE 80 MG PO CHEW
80.0000 mg | CHEWABLE_TABLET | ORAL | Status: DC | PRN
  Administered 2014-10-09: 80 mg via ORAL
  Filled 2014-10-08: qty 1

## 2014-10-08 MED ORDER — BENZOCAINE-MENTHOL 20-0.5 % EX AERO
1.0000 "application " | INHALATION_SPRAY | CUTANEOUS | Status: DC | PRN
  Administered 2014-10-08: 1 via TOPICAL
  Filled 2014-10-08 (×2): qty 56

## 2014-10-08 MED ORDER — INSULIN ASPART 100 UNIT/ML ~~LOC~~ SOLN
0.0000 [IU] | Freq: Three times a day (TID) | SUBCUTANEOUS | Status: DC
  Administered 2014-10-09: 1 [IU] via SUBCUTANEOUS
  Administered 2014-10-09: 2 [IU] via SUBCUTANEOUS
  Administered 2014-10-09 – 2014-10-10 (×2): 1 [IU] via SUBCUTANEOUS

## 2014-10-08 MED ORDER — DIBUCAINE 1 % RE OINT
1.0000 "application " | TOPICAL_OINTMENT | RECTAL | Status: DC | PRN
  Filled 2014-10-08: qty 28

## 2014-10-08 MED ORDER — WITCH HAZEL-GLYCERIN EX PADS: 1.0000 "application " | MEDICATED_PAD | CUTANEOUS | Status: DC | PRN

## 2014-10-08 MED ORDER — ONDANSETRON HCL 4 MG PO TABS: 4.0000 mg | ORAL_TABLET | ORAL | Status: DC | PRN

## 2014-10-08 MED ORDER — DIPHENHYDRAMINE HCL 25 MG PO CAPS: 25.0000 mg | ORAL_CAPSULE | Freq: Four times a day (QID) | ORAL | Status: DC | PRN

## 2014-10-08 MED ORDER — TETANUS-DIPHTH-ACELL PERTUSSIS 5-2.5-18.5 LF-MCG/0.5 IM SUSP
0.5000 mL | Freq: Once | INTRAMUSCULAR | Status: DC
  Filled 2014-10-08: qty 0.5

## 2014-10-08 MED ORDER — INSULIN ASPART 100 UNIT/ML ~~LOC~~ SOLN
0.0000 [IU] | Freq: Three times a day (TID) | SUBCUTANEOUS | Status: DC
  Administered 2014-10-08: 10 [IU] via SUBCUTANEOUS
  Administered 2014-10-09: 9 [IU] via SUBCUTANEOUS
  Administered 2014-10-09: 3 [IU] via SUBCUTANEOUS
  Administered 2014-10-09: 9 [IU] via SUBCUTANEOUS
  Administered 2014-10-09: 10 [IU] via SUBCUTANEOUS
  Administered 2014-10-10: 9 [IU] via SUBCUTANEOUS

## 2014-10-08 MED ORDER — INSULIN ASPART 100 UNIT/ML ~~LOC~~ SOLN
5.0000 [IU] | Freq: Three times a day (TID) | SUBCUTANEOUS | Status: DC
Start: 2014-10-08 — End: 2014-10-08
  Administered 2014-10-08: 8 [IU] via SUBCUTANEOUS

## 2014-10-08 MED ORDER — INSULIN DETEMIR 100 UNIT/ML ~~LOC~~ SOLN
20.0000 [IU] | Freq: Every day | SUBCUTANEOUS | Status: DC
  Administered 2014-10-08 – 2014-10-10 (×3): 20 [IU] via SUBCUTANEOUS
  Filled 2014-10-08 (×3): qty 0.2

## 2014-10-08 MED ORDER — ONDANSETRON HCL 4 MG/2ML IJ SOLN: 4.0000 mg | INTRAMUSCULAR | Status: DC | PRN

## 2014-10-08 MED ORDER — LANOLIN HYDROUS EX OINT: TOPICAL_OINTMENT | CUTANEOUS | Status: DC | PRN

## 2014-10-08 MED ORDER — OXYCODONE-ACETAMINOPHEN 5-325 MG PO TABS: 2.0000 | ORAL_TABLET | ORAL | Status: DC | PRN

## 2014-10-08 MED ORDER — PRENATAL MULTIVITAMIN CH
1.0000 | ORAL_TABLET | Freq: Every day | ORAL | Status: DC
  Administered 2014-10-08 – 2014-10-10 (×3): 1 via ORAL
  Filled 2014-10-08 (×3): qty 1

## 2014-10-08 MED ORDER — SENNOSIDES-DOCUSATE SODIUM 8.6-50 MG PO TABS
2.0000 | ORAL_TABLET | ORAL | Status: DC
  Administered 2014-10-08 – 2014-10-10 (×2): 2 via ORAL
  Filled 2014-10-08 (×2): qty 2

## 2014-10-08 MED ORDER — INSULIN PUMP: SUBCUTANEOUS | Status: DC

## 2014-10-08 NOTE — Anesthesia Postprocedure Evaluation (Signed)
  Anesthesia Post-op Note  Patient: Norma Allen  Procedure(s) Performed: * No procedures listed *  Patient Location: Birthing Suites--postpartum (bed shortage on M[facility])  Anesthesia Type:Epidural  Level of Consciousness: awake, alert , oriented and patient cooperative  Airway and Oxygen Therapy: Patient Spontanous Breathing  Post-op Pain: none  Post-op Assessment: Post-op Vital signs reviewed, Patient's Cardiovascular Status Stable, Respiratory Function Stable, Patent Airway, No headache, No backache and Patient able to bend at knees              Post-op Vital Signs: Reviewed and stable  Last Vitals:  Filed Vitals:   10/08/14 0831  BP: 132/78  Pulse: 112  Temp:   Resp:     Complications: No apparent anesthesia complications

## 2014-10-08 NOTE — Progress Notes (Signed)
MOB was referred for history of depression/anxiety. * Referral screened out by Clinical Social Worker because none of the following criteria appear to apply: ~ History of anxiety/depression during this pregnancy, or of post-partum depression. ~ Diagnosis of anxiety and/or depression within last 3 years OR * MOB's symptoms currently being treated with medication and/or therapy. Please contact the Clinical Social Worker if needs arise, or if MOB requests.   

## 2014-10-09 LAB — CBC
HCT: 28.5 % — ABNORMAL LOW (ref 36.0–46.0)
HEMOGLOBIN: 9.5 g/dL — AB (ref 12.0–15.0)
MCH: 27.3 pg (ref 26.0–34.0)
MCHC: 33.3 g/dL (ref 30.0–36.0)
MCV: 81.9 fL (ref 78.0–100.0)
Platelets: 178 10*3/uL (ref 150–400)
RBC: 3.48 MIL/uL — ABNORMAL LOW (ref 3.87–5.11)
RDW: 12.8 % (ref 11.5–15.5)
WBC: 11.9 10*3/uL — ABNORMAL HIGH (ref 4.0–10.5)

## 2014-10-09 LAB — GLUCOSE, CAPILLARY
GLUCOSE-CAPILLARY: 188 mg/dL — AB (ref 65–99)
GLUCOSE-CAPILLARY: 243 mg/dL — AB (ref 65–99)
Glucose-Capillary: 157 mg/dL — ABNORMAL HIGH (ref 65–99)
Glucose-Capillary: 190 mg/dL — ABNORMAL HIGH (ref 65–99)
Glucose-Capillary: 68 mg/dL (ref 65–99)
Glucose-Capillary: 76 mg/dL (ref 65–99)

## 2014-10-09 NOTE — Progress Notes (Signed)
Patient ID: Norma Allen, female   DOB: [birth year], 29 y.o.   MRN: [id number] #1 afebrile BP normal BS a little high but she feels she can get it under control.

## 2014-10-09 NOTE — Progress Notes (Signed)
Hypoglycemic Event  CBG: 68 @ 1814  Treatment: crackers with peanut butter  Symptoms: stated felt mildly lethargic  Follow-up CBG: Time: 1840 CBG Result: 76  Possible Reasons for Event:  Comments/MD notified: States she treats hypoglycemia at home with crackers/peanut butter. States she feels better after eating and cbg within normal range.    Norma Allen [name]  Remember to initiate Hypoglycemia Order Set & complete

## 2014-10-10 LAB — GLUCOSE, CAPILLARY
Glucose-Capillary: 169 mg/dL — ABNORMAL HIGH (ref 65–99)
Glucose-Capillary: 153 mg/dL — ABNORMAL HIGH (ref 65–99)

## 2014-10-10 NOTE — Discharge Instructions (Signed)
booklet °

## 2014-10-10 NOTE — Progress Notes (Signed)
Patient ID: Norma Allen, female   DOB: 07/11/1985, 29 y.o.   MRN: [id number] #2 afebrile BS controlled for d/c

## 2014-10-10 NOTE — Discharge Summary (Signed)
Norma Allen, Norma Allen NO.:  [id number]  MEDICAL RECORD NO.:  [id number]  LOCATION:  9107                          FACILITY:  [facility]  PHYSICIAN:  Malachi Pro. Ambrose Mantle, M.D. DATE OF BIRTH:  [birth year]  DATE OF ADMISSION:  10/07/2014 DATE OF DISCHARGE:  10/10/2014                              DISCHARGE SUMMARY   HISTORY OF PRESENT ILLNESS:  This is a 29 year old white female, para 0, gravida 1, at 37+ weeks gestation, with preeclampsia and type 1 diabetes.  She called with questionable rupture of membranes.  Sugars were initially well controlled.  Recent hemoglobin A1c was 8.4. Induction of labor was planned at 38 weeks.  Preeclampsia was diagnosed with elevated blood pressure and protein creatinine ratio of 0.6.  The patient was admitted and placed on Pitocin.  The patient received an epidural.  At 9:25 p.m., the membranes were ruptured with clear fluid. Sugars were controlled by the Glucommander usually in the 150s.  At 11:36 p.m., the cervix was 5-1/2 cm, intrauterine pressure catheter was placed, and at 6:40 a.m., a living female infant was delivered vaginally spontaneously by Dr. Ellyn Hack.  Apgars of 7 and 9.  Weight was 8 pounds 10 ounces.  Placenta delivered intact.  Second-degree perineal laceration and introital laceration repaired with 3-0 Vicryl repeat.  Blood loss about 400 mL.  Postpartum, the patient did well.  She managed to run blood sugars, they have been well controlled and on the second postpartum day she was ready for discharge.  Initial hemoglobin 11.9, hematocrit 35, white count 9800, and platelet count 201,000.  Followup hemoglobins were 11.7 and 9.5.  Her blood sugars since yesterday morning have been 188, 190, 68, 76, and 169.  FINAL DIAGNOSES:  Intrauterine pregnancy at 37+ weeks, delivered vertex, type 1 diabetes, preeclampsia.  The blood pressures during labor were normal enough, that no treatment was required for the preeclampsia. Blood sugars  were controlled with the Glucommander during labor.  OPERATION:  Spontaneous delivery vertex, repair of second-degree laceration and an introital laceration.  FINAL CONDITION:  Improved.  The patient will continue to manage her diabetes by herself with Dr. Bruna Potter assistance.  She declines any analgesics at discharge.  She is advised to continue her prenatal vitamins and return to the office to see Dr. Ellyn Hack in 6 weeks for followup examination.     Malachi Pro. Ambrose Mantle, M.D.     TFH/MEDQ  D:  10/10/2014  T:  10/10/2014  Job:  [id number]

## 2014-10-11 ENCOUNTER — Ambulatory Visit: Payer: Self-pay

## 2014-10-11 NOTE — Lactation Note (Signed)
This note was copied from the chart of Girl Louise Finkle. Lactation Consultation Note  Patient Name: Girl Drenna Thuma [id number] Date: 10/11/2014 Reason for consult: Follow-up assessment;Hyperbilirubinemia;Other (Comment) (type I diabetes) Baby 74 hours old. Mom reports that she has pumped "maybe twice" in the last 24 hours. Discussed supply and demand and ways of assuring a good milk supply. Mom states that she does not want to put baby to breast because her nipples are flat and she has been overwhelmed with visitors. Discussed the need to limit visits and enc getting the word out for friends and family. Enc mom to pump 8 times a day for 15 minutes in order to establish a good milk supply. Also discussed OP/BFSG and LC phone line assistance after D/C. Referred mom to Baby and Me booklet for number of diapers to expect by day of life and EBM storage guidelines.  Maternal Data    Feeding    LATCH Score/Interventions                      Lactation Tools Discussed/Used     Consult Status Consult Status: PRN    Geralynn Ochs 10/11/2014, 8:51 AM

## 2014-10-12 ENCOUNTER — Inpatient Hospital Stay (HOSPITAL_COMMUNITY): Admission: RE | Admit: 2014-10-12 | Payer: BC Managed Care – PPO | Source: Ambulatory Visit

## 2014-10-12 LAB — GLUCOSE, CAPILLARY
GLUCOSE-CAPILLARY: 170 mg/dL — AB (ref 65–99)
GLUCOSE-CAPILLARY: 66 mg/dL (ref 65–99)
Glucose-Capillary: 70 mg/dL (ref 65–99)
Glucose-Capillary: 188 mg/dL — ABNORMAL HIGH (ref 65–99)

## 2015-01-20 IMAGING — CR DG CHEST 1V PORT
1 series · 1 of 1 positions shown · non-contrast
Comparison: No priors.

CLINICAL DATA: Influenza.  Smoker.

EXAM:
PORTABLE CHEST - 1 VIEW

[portable]
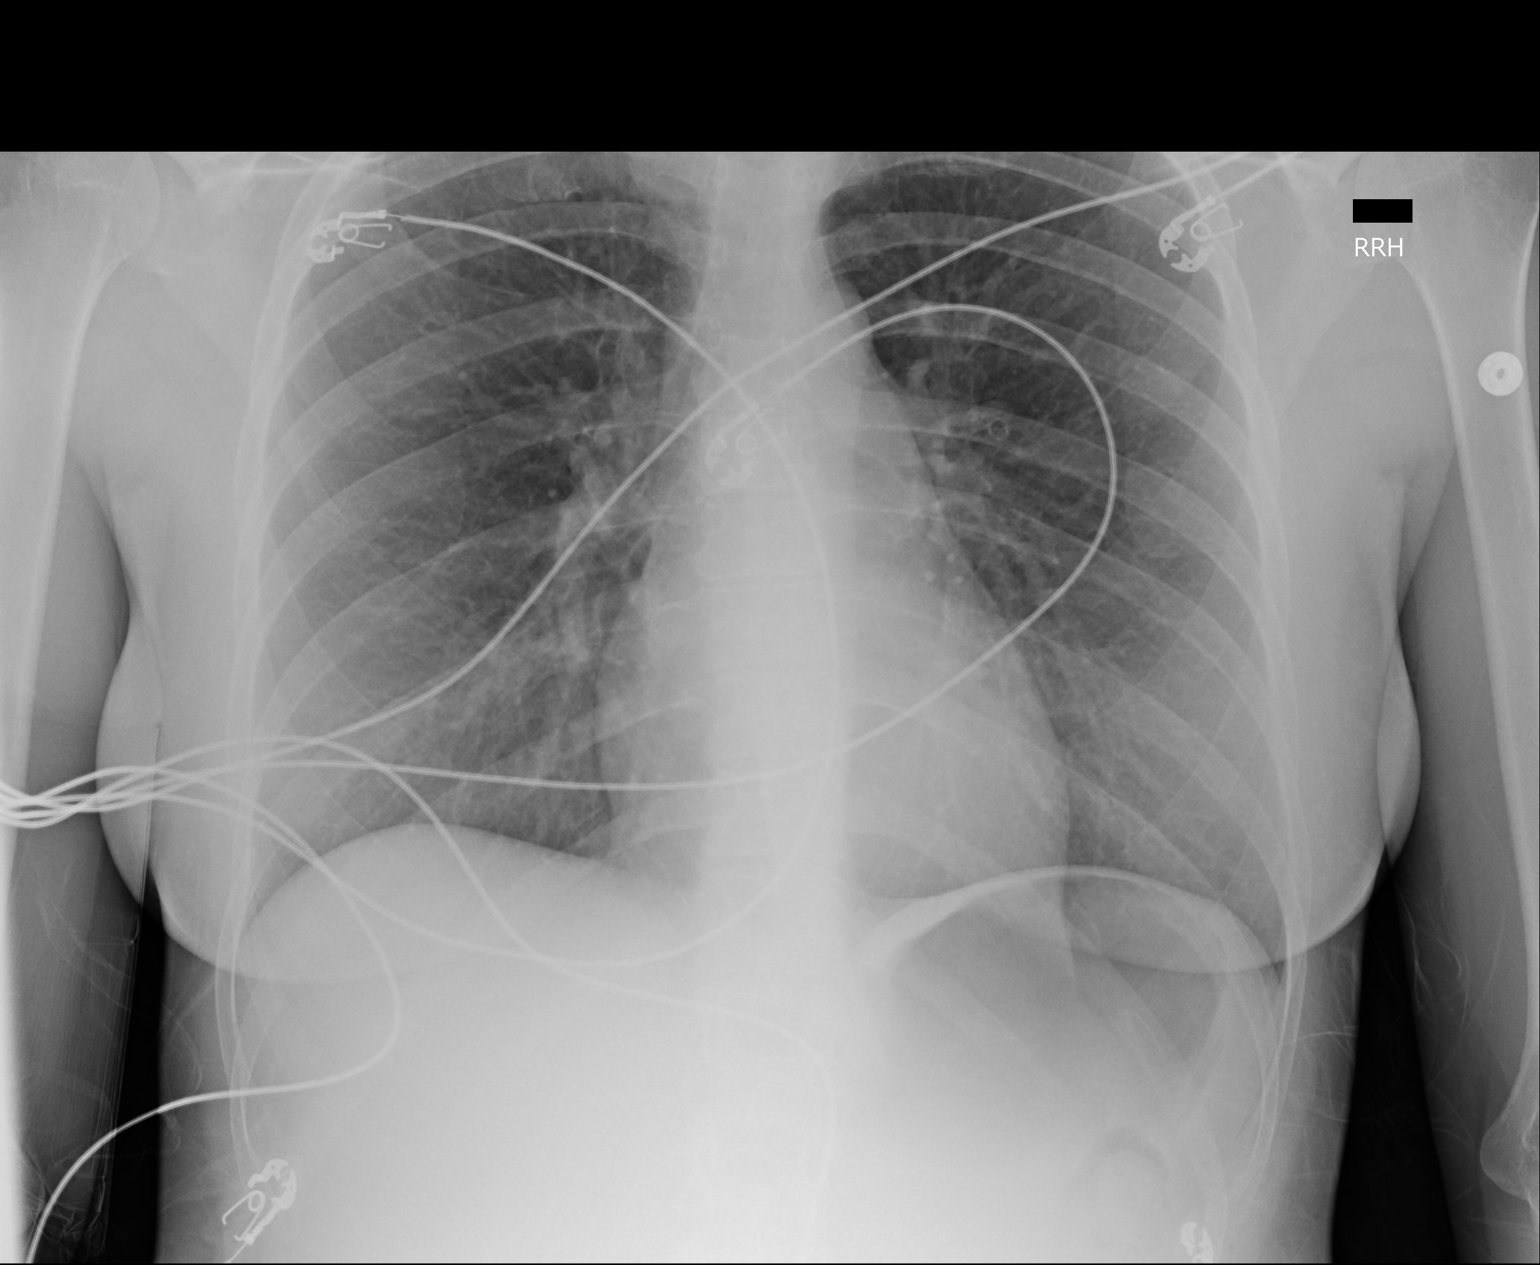

[1 of 1 positions shown; findings below may reference images not displayed]

FINDINGS: Lung volumes are normal. No consolidative airspace disease. No
pleural effusions. No pneumothorax. No pulmonary nodule or mass
noted. Pulmonary vasculature and the cardiomediastinal silhouette
are within normal limits.
IMPRESSION: 1.  No radiographic evidence of acute cardiopulmonary disease.

## 2015-03-22 ENCOUNTER — Other Ambulatory Visit: Payer: Self-pay | Admitting: Obstetrics and Gynecology

## 2015-03-22 DIAGNOSIS — N6310 Unspecified lump in the right breast, unspecified quadrant: Secondary | ICD-10-CM

## 2015-04-06 ENCOUNTER — Other Ambulatory Visit: Payer: Self-pay | Admitting: Obstetrics and Gynecology

## 2015-04-06 ENCOUNTER — Ambulatory Visit
Admission: RE | Admit: 2015-04-06 | Discharge: 2015-04-06 | Disposition: A | Discharge status: Home / Self Care | Payer: BC Managed Care – PPO | Source: Ambulatory Visit | Attending: Obstetrics and Gynecology | Admitting: Obstetrics and Gynecology

## 2015-04-06 DIAGNOSIS — N6310 Unspecified lump in the right breast, unspecified quadrant: Secondary | ICD-10-CM

## 2016-02-11 ENCOUNTER — Encounter (HOSPITAL_COMMUNITY): Payer: Self-pay

## 2016-02-11 ENCOUNTER — Emergency Department (HOSPITAL_COMMUNITY)
Admission: EM | Admit: 2016-02-11 | Discharge: 2016-02-11 | Disposition: A | Discharge status: Home / Self Care | Payer: BC Managed Care – PPO | Attending: Emergency Medicine | Admitting: Emergency Medicine

## 2016-02-11 ENCOUNTER — Emergency Department (HOSPITAL_COMMUNITY): Payer: BC Managed Care – PPO

## 2016-02-11 DIAGNOSIS — E1065 Type 1 diabetes mellitus with hyperglycemia: Secondary | ICD-10-CM | POA: Diagnosis not present

## 2016-02-11 DIAGNOSIS — Z87891 Personal history of nicotine dependence: Secondary | ICD-10-CM | POA: Diagnosis not present

## 2016-02-11 DIAGNOSIS — Z794 Long term (current) use of insulin: Secondary | ICD-10-CM | POA: Diagnosis not present

## 2016-02-11 DIAGNOSIS — J111 Influenza due to unidentified influenza virus with other respiratory manifestations: Secondary | ICD-10-CM | POA: Diagnosis not present

## 2016-02-11 DIAGNOSIS — R739 Hyperglycemia, unspecified: Secondary | ICD-10-CM

## 2016-02-11 DIAGNOSIS — R69 Illness, unspecified: Secondary | ICD-10-CM

## 2016-02-11 LAB — CBC WITH DIFFERENTIAL/PLATELET
BASOS ABS: 0 10*3/uL (ref 0.0–0.1)
Basophils Relative: 0 %
EOS ABS: 0.1 10*3/uL (ref 0.0–0.7)
EOS PCT: 0 %
HCT: 43.9 % (ref 36.0–46.0)
Hemoglobin: 15.5 g/dL — ABNORMAL HIGH (ref 12.0–15.0)
Lymphocytes Relative: 6 %
Lymphs Abs: 1 10*3/uL (ref 0.7–4.0)
MCH: 30.9 pg (ref 26.0–34.0)
MCHC: 35.3 g/dL (ref 30.0–36.0)
MCV: 87.6 fL (ref 78.0–100.0)
Monocytes Absolute: 0.8 10*3/uL (ref 0.1–1.0)
Monocytes Relative: 5 %
Neutro Abs: 14.5 10*3/uL — ABNORMAL HIGH (ref 1.7–7.7)
Neutrophils Relative %: 89 %
PLATELETS: 276 10*3/uL (ref 150–400)
RBC: 5.01 MIL/uL (ref 3.87–5.11)
RDW: 11.6 % (ref 11.5–15.5)
WBC: 16.5 10*3/uL — AB (ref 4.0–10.5)

## 2016-02-11 LAB — URINALYSIS, ROUTINE W REFLEX MICROSCOPIC
Bilirubin Urine: NEGATIVE
GLUCOSE, UA: 500 mg/dL — AB
Hgb urine dipstick: NEGATIVE
LEUKOCYTES UA: NEGATIVE
Nitrite: NEGATIVE
PROTEIN: NEGATIVE mg/dL
Specific Gravity, Urine: 1.01 (ref 1.005–1.030)
pH: 6.5 (ref 5.0–8.0)

## 2016-02-11 LAB — I-STAT BETA HCG BLOOD, ED (MC, WL, AP ONLY): I-stat hCG, quantitative: <5 m[IU]/mL (ref <5)

## 2016-02-11 LAB — COMPREHENSIVE METABOLIC PANEL
ALT: 15 U/L (ref 14–54)
AST: 23 U/L (ref 15–41)
Albumin: 4 g/dL (ref 3.5–5.0)
Alkaline Phosphatase: 84 U/L (ref 38–126)
Anion gap: 6 (ref 5–15)
BUN: 14 mg/dL (ref 6–20)
CHLORIDE: 103 mmol/L (ref 101–111)
CO2: 25 mmol/L (ref 22–32)
CREATININE: 0.56 mg/dL (ref 0.44–1.00)
Calcium: 8.9 mg/dL (ref 8.9–10.3)
GFR calc non Af Amer: >60 mL/min (ref >60)
Glucose, Bld: 191 mg/dL — ABNORMAL HIGH (ref 65–99)
Potassium: 3.6 mmol/L (ref 3.5–5.1)
SODIUM: 134 mmol/L — AB (ref 135–145)
Total Bilirubin: 0.6 mg/dL (ref 0.3–1.2)
Total Protein: 7.6 g/dL (ref 6.5–8.1)

## 2016-02-11 LAB — CBG MONITORING, ED: GLUCOSE-CAPILLARY: 184 mg/dL — AB (ref 65–99)

## 2016-02-11 MED ORDER — SODIUM CHLORIDE 0.9 % IV BOLUS (SEPSIS)
1000.0000 mL | Freq: Once | INTRAVENOUS | Status: AC
  Administered 2016-02-11: 1000 mL via INTRAVENOUS

## 2016-02-11 MED ORDER — KETOROLAC TROMETHAMINE 30 MG/ML IJ SOLN
30.0000 mg | Freq: Once | INTRAMUSCULAR | Status: AC
  Administered 2016-02-11: 30 mg via INTRAVENOUS
  Filled 2016-02-11: qty 1

## 2016-02-11 NOTE — Discharge Instructions (Signed)
Drink plenty of fluids and get plenty of rest.  Return for worsening symptoms, including intractable vomiting, difficulty breathing, confusion or any other symptoms concerning to you.

## 2016-02-11 NOTE — ED Triage Notes (Signed)
Complain of nausea and generalized weakness. CBG 255 today but pt states she did not take her insulin.

## 2016-02-11 NOTE — ED Provider Notes (Signed)
4:29 PM Assumed care from Dr. Verdie MosherLiu, please see their note for full history, physical and decision making until this point. In brief this is a 30 y.o. year old female who presented to the ED tonight with Hyperglycemia (nausea)     Patient with flulike illness causing myalgias and malaise. Had a white blood cell count 16 no obvious bacterial source. 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 No evidence of DKA. Patient well-hydrated at time of discharge. Vital signs within normal limits. Plan follow-up with primary doctor as needed. Return here for new or worsening symptoms.  Discharge instructions, including strict return precautions for new or worsening symptoms, given. Patient and/or family verbalized understanding and agreement with the plan as described.   Labs, studies and imaging reviewed by myself and considered in medical decision making if ordered. Imaging interpreted by radiology.  Labs Reviewed  CBC WITH DIFFERENTIAL/PLATELET - Abnormal; Notable for the following:       Result Value   WBC 16.5 (*)    Hemoglobin 15.5 (*)    Neutro Abs 14.5 (*)    All other components within normal limits  COMPREHENSIVE METABOLIC PANEL - Abnormal; Notable for the following:    Sodium 134 (*)    Glucose, Bld 191 (*)    All other components within normal limits  URINALYSIS, ROUTINE W REFLEX MICROSCOPIC ([facility]) - Abnormal; Notable for the following:    APPearance HAZY (*)    Glucose, UA 500 (*)    Ketones, ur TRACE (*)    All other components within normal limits  CBG MONITORING, ED - Abnormal; Notable for the following:    Glucose-Capillary 184 (*)    All other components within normal limits  I-STAT BETA HCG BLOOD, ED ([facility], [facility], [facility] ONLY)    DG Chest 2 View  Final Result      No Follow-up on file.    Marily MemosJason Jermario Kalmar, MD 02/11/16 [phone]

## 2016-02-11 NOTE — ED Notes (Signed)
Pt is type 1 diabetic. C/o nausea, fatigue and generalized body aches x 2 days worse today.

## 2016-02-11 NOTE — ED Provider Notes (Signed)
[facility]-EMERGENCY DEPT Provider Note   CSN: [id number] Arrival date & time: 02/11/16  1327     History   Chief Complaint Chief Complaint  Patient presents with  . Hyperglycemia    nausea    HPI Norma Allen is a 30 y.o. female.  HPI   30 year old female who presents with generalized fatigue and weakness. She has a history of type 1 diabetes. States that for the past 2 days has been feeling unwell, with weakness and fatigue. States that this morning she woke up with generalized body aches and pains. She did check her blood sugar this morning, and it was low at 44. Ate something, sugar came up. Later this morning, continued to feel tired, with aches pain, and felt restless. Thinks she may have felt similarly with DKA in the past, which is why she came to ED today for evaluation. Has had mild congestion with scratchy throat. No fever, chills, cough, phlegm, chest pain or difficulty breathing, abdominal pain, vomiting. With mild nausea. This week having some diarrhea.   Past Medical History:  Diagnosis Date  . Depression    meds 3 years ago  . Diabetes mellitus type I (HCC)   . DKA (diabetic ketoacidoses) (HCC)   . Hx of varicella   . SVD (spontaneous vaginal delivery) 10/08/2014    Patient Active Problem List   Diagnosis Date Noted  . SVD (spontaneous vaginal delivery) 10/08/2014  . Pre-eclampsia 10/07/2014  . DKA (diabetic ketoacidoses) (HCC) 06/23/2013  . Hyperkalemia 06/23/2013  . Hyponatremia 06/23/2013  . Tachycardia 06/23/2013  . Nausea 06/23/2013  . Diabetes mellitus type I (HCC)   . Depression     Past Surgical History:  Procedure Laterality Date  . WISDOM TOOTH EXTRACTION      OB History    Gravida Para Term Preterm AB Living   1 1 1     1    SAB TAB Ectopic Multiple Live Births         0 1       Home Medications    Prior to Admission medications   Medication Sig Start Date End Date Taking? Authorizing Provider  Insulin Glargine (BASAGLAR KWIKPEN) 100  UNIT/ML SOPN Inject 28-30 Units into the skin 2 (two) times daily. 30 units in the morning and 28 units at night. 12/26/15  Yes Historical Provider, MD  NOVOLOG 100 UNIT/ML injection Inject 3.7-5.2 Units into the skin 3 (three) times daily with meals.  05/26/13  Yes Historical Provider, MD  sertraline (ZOLOFT) 50 MG tablet Take 1 tablet by mouth daily. 12/24/15  Yes Historical Provider, MD    Family History Family History  Problem Relation Age of Onset  . Cancer Mother   . Hypertension Mother   . Diabetes Father   . Hypertension Father     Social History Social History  Substance Use Topics  . Smoking status: Former Smoker    Packs/day: 0.50    Years: 4.00    Types: Cigarettes    Quit date: 03/07/2014  . Smokeless tobacco: Never Used  . Alcohol use No     Allergies   Review of patient's allergies indicates no known allergies.   Review of Systems Review of Systems 10/14 systems reviewed and are negative other than those stated in the HPI   Physical Exam Updated Vital Signs BP 109/76   Pulse 115   Temp 98.9 F (37.2 C)   Resp 17   Ht 5\' 6"  (1.676 m)   Wt 150 lb (68 kg)  LMP 02/05/2016   SpO2 100%   BMI 24.21 kg/m   Physical Exam Physical Exam  Nursing note and vitals reviewed. Constitutional: slightly anxious and restless, non-toxic, and in no acute distress Head: Normocephalic and atraumatic.  Mouth/Throat: Oropharynx is clear and moist.  Neck: Normal range of motion. Neck supple.  Cardiovascular: Tachycardic rate and regular rhythm.  No edema. Pulmonary/Chest: Effort normal and breath sounds normal.  Abdominal: Soft. There is no tenderness. There is no rebound and no guarding.  Musculoskeletal: Normal range of motion.  Neurological: Alert, no facial droop, fluent speech, moves all extremities symmetrically Skin: Skin is warm and dry.  Psychiatric: Cooperative   ED Treatments / Results  Labs (all labs ordered are listed, but only abnormal results are  displayed) Labs Reviewed  CBC WITH DIFFERENTIAL/PLATELET - Abnormal; Notable for the following:       Result Value   WBC 16.5 (*)    Hemoglobin 15.5 (*)    Neutro Abs 14.5 (*)    All other components within normal limits  COMPREHENSIVE METABOLIC PANEL - Abnormal; Notable for the following:    Sodium 134 (*)    Glucose, Bld 191 (*)    All other components within normal limits  URINALYSIS, ROUTINE W REFLEX MICROSCOPIC (NOT AT [facility]) - Abnormal; Notable for the following:    APPearance HAZY (*)    Glucose, UA 500 (*)    Ketones, ur TRACE (*)    All other components within normal limits  CBG MONITORING, ED - Abnormal; Notable for the following:    Glucose-Capillary 184 (*)    All other components within normal limits  I-STAT BETA HCG BLOOD, ED ([facility], [facility], [facility] ONLY)    EKG  EKG Interpretation None       Radiology Dg Chest 2 View  Result Date: 02/11/2016 CLINICAL DATA:  Pt is type 1 diabetic. C/o nausea, fatigue and generalized body aches x 2 days worse todayPATIENT STATES " SHE CANT SLEEP DUE TO THE BODY ACHES, IS DIZZY, AND VERY TIRED AND WEAK"HISTORY OF DM, VARICELLA, DEPRESSION EXAM: CHEST  2 VIEW COMPARISON:  06/23/2013 FINDINGS: The heart size and mediastinal contours are within normal limits. Both lungs are clear. No pleural effusion or pneumothorax. The visualized skeletal structures are unremarkable. IMPRESSION: No active cardiopulmonary disease. Electronically Signed   By: Amie Portland M.D.   On: 02/11/2016 15:01    Procedures Procedures (including critical care time)  Medications Ordered in ED Medications  sodium chloride 0.9 % bolus 1,000 mL (0 mLs Intravenous Stopped 02/11/16 1440)  sodium chloride 0.9 % bolus 1,000 mL (0 mLs Intravenous Stopped 02/11/16 1536)  ketorolac (TORADOL) 30 MG/ML injection 30 mg (30 mg Intravenous Given 02/11/16 1438)  sodium chloride 0.9 % bolus 1,000 mL (0 mLs Intravenous Stopped 02/11/16 1632)     Initial Impression / Assessment and  Plan / ED Course  I have reviewed the triage vital signs and the nursing notes.  Pertinent labs & imaging results that were available during my care of the patient were reviewed by me and considered in my medical decision making (see chart for details).  Clinical Course    History of type 1 diabetes who presents with 2 days of fatigue, body aches, and weakness. Is not toxic and in no acute distress. Tachycardic, but normotensive and afebrile. With POCT glucose of 184. Suspect early influenza or viral illness. No evidence of DKA. Leukocytosis of 16, which can still be c/w viral illness. Pending CXR and UA to evaluate for  pyelonephritis and pneumona. Given IVF and toradol for supportive care. Care passed on to dr. Clayborne Deano Tomaszewski to follow-up CXR, UA and re-evaluation after supportive care.     Final Clinical Impressions(s) / ED Diagnoses   Final diagnoses:  Hyperglycemia  Influenza-like illness    New Prescriptions Discharge Medication List as of 02/11/2016  4:27 PM       Lavera Guise, MD 02/12/16 [id number]

## 2016-07-30 ENCOUNTER — Ambulatory Visit (INDEPENDENT_AMBULATORY_CARE_PROVIDER_SITE_OTHER): Payer: BC Managed Care – PPO | Admitting: Otolaryngology

## 2016-07-30 DIAGNOSIS — J343 Hypertrophy of nasal turbinates: Secondary | ICD-10-CM | POA: Diagnosis not present

## 2016-07-30 DIAGNOSIS — H6983 Other specified disorders of Eustachian tube, bilateral: Secondary | ICD-10-CM | POA: Diagnosis not present

## 2016-07-30 DIAGNOSIS — J310 Chronic rhinitis: Secondary | ICD-10-CM

## 2016-07-30 DIAGNOSIS — H900 Conductive hearing loss, bilateral: Secondary | ICD-10-CM

## 2016-08-20 ENCOUNTER — Ambulatory Visit (INDEPENDENT_AMBULATORY_CARE_PROVIDER_SITE_OTHER): Payer: BC Managed Care – PPO | Admitting: Otolaryngology

## 2016-11-13 ENCOUNTER — Ambulatory Visit: Payer: BC Managed Care – PPO | Admitting: Allergy & Immunology

## 2017-02-08 ENCOUNTER — Other Ambulatory Visit (HOSPITAL_COMMUNITY): Payer: Self-pay | Admitting: Internal Medicine

## 2017-02-08 DIAGNOSIS — R109 Unspecified abdominal pain: Secondary | ICD-10-CM

## 2017-02-11 ENCOUNTER — Ambulatory Visit (HOSPITAL_COMMUNITY)
Admission: RE | Admit: 2017-02-11 | Discharge: 2017-02-11 | Disposition: A | Discharge status: Home / Self Care | Payer: BC Managed Care – PPO | Source: Ambulatory Visit | Attending: Internal Medicine | Admitting: Internal Medicine

## 2017-02-11 DIAGNOSIS — R109 Unspecified abdominal pain: Secondary | ICD-10-CM | POA: Insufficient documentation

## 2018-01-11 IMAGING — US US ABDOMEN COMPLETE
1 series · 14 of 25 positions shown · non-contrast
Comparison: None.

CLINICAL DATA: Acute flank pain.

EXAM:
ABDOMEN ULTRASOUND COMPLETE

[Series 1: us abdomen complete · 0.21mm/px · 14 of 91 slices shown]
[im 1/91]
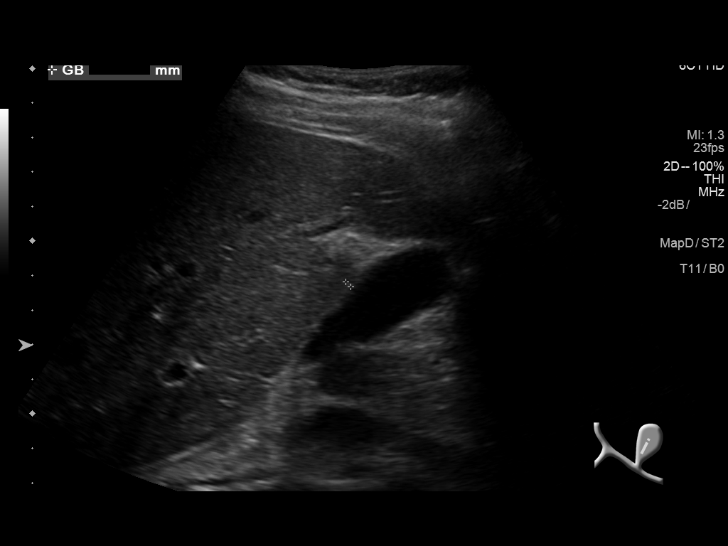
[im 8/91]
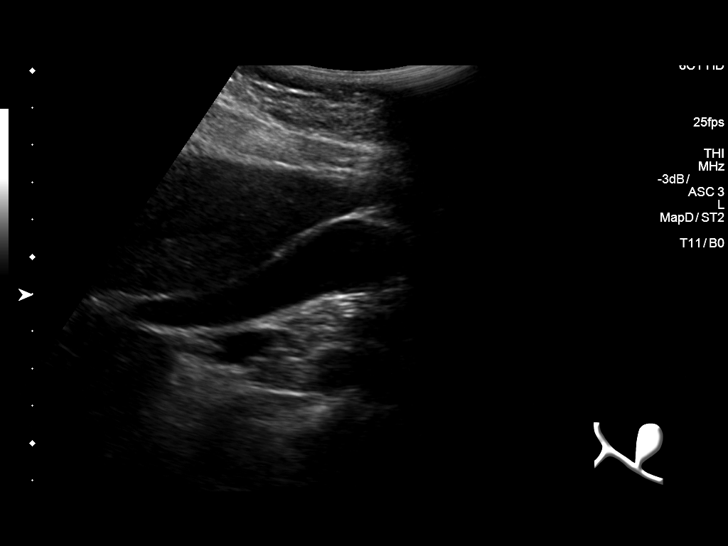
[im 16/91]
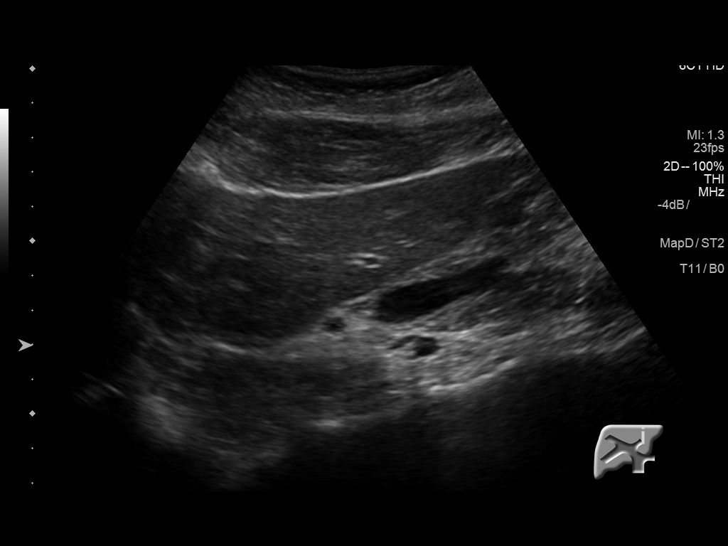
[im 23/91]
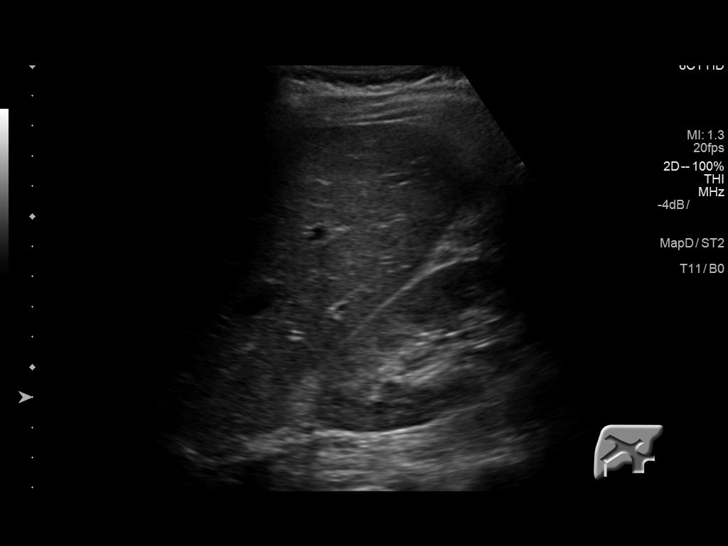
[im 31/91]
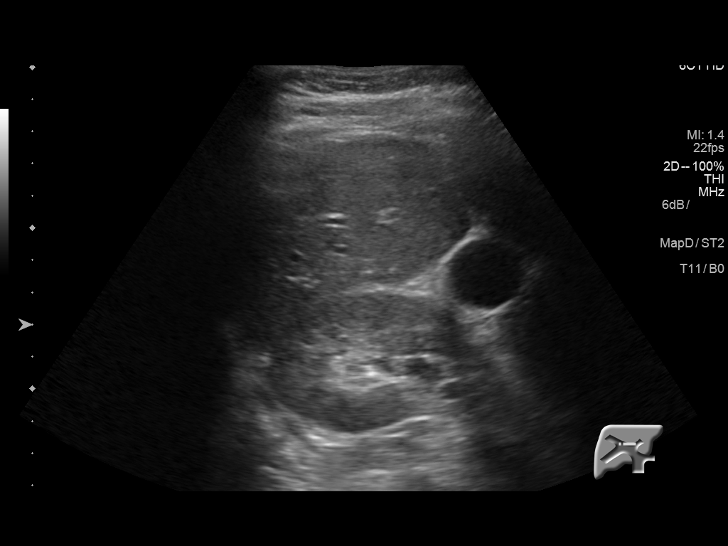
[im 34/91]
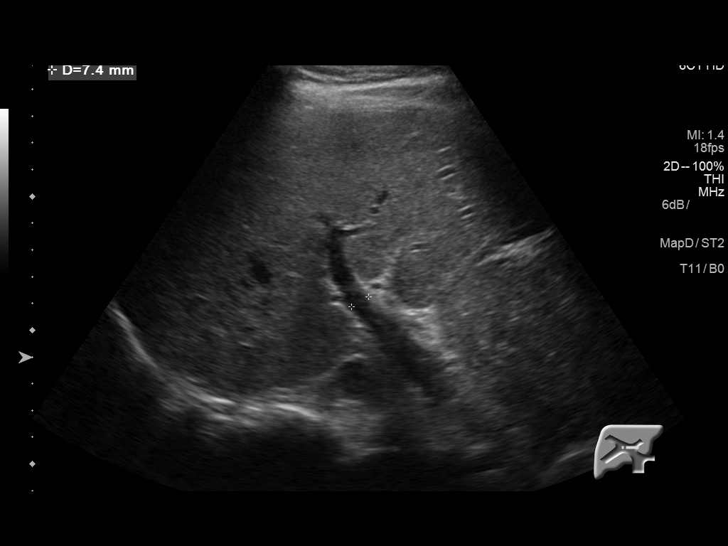
[im 42/91]
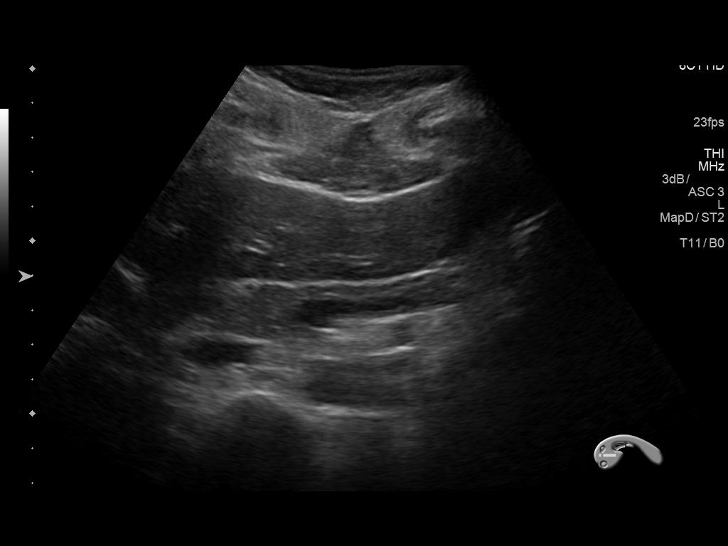
[im 49/91]
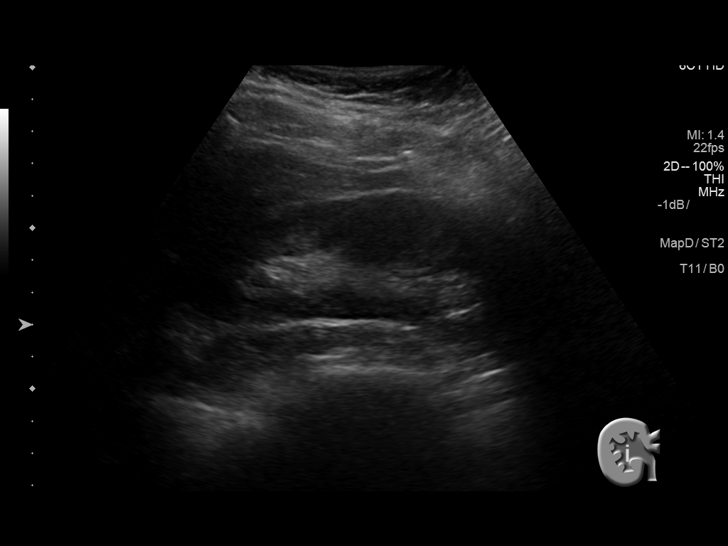
[im 57/91]
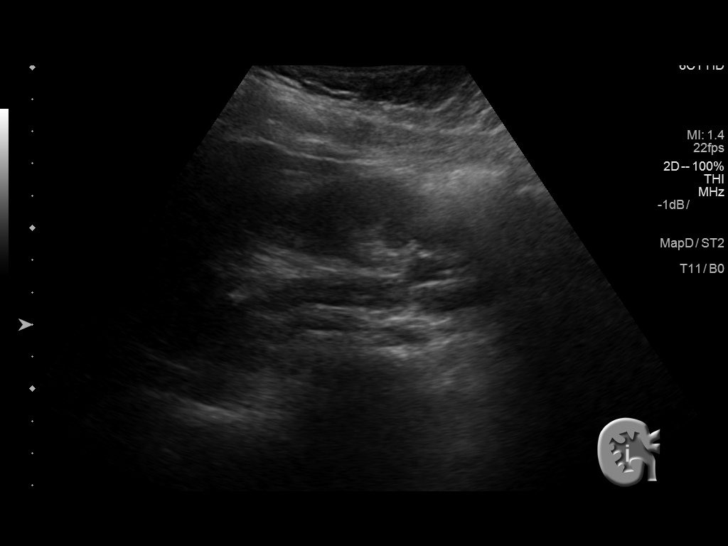
[im 61/91]
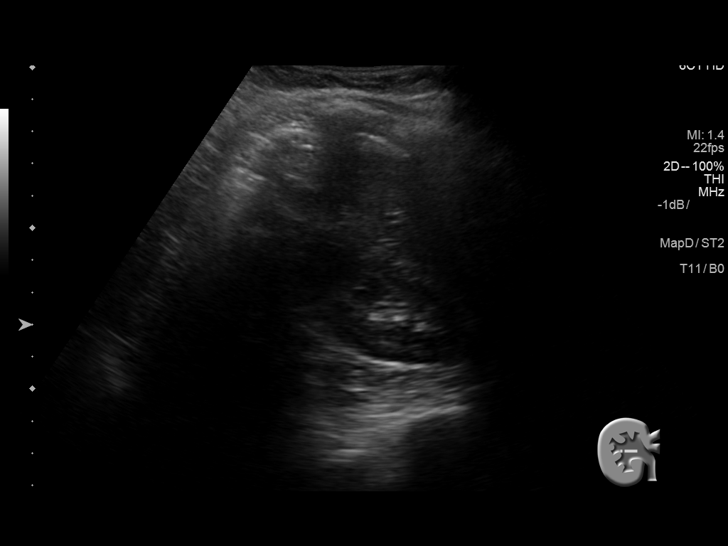
[im 68/91]
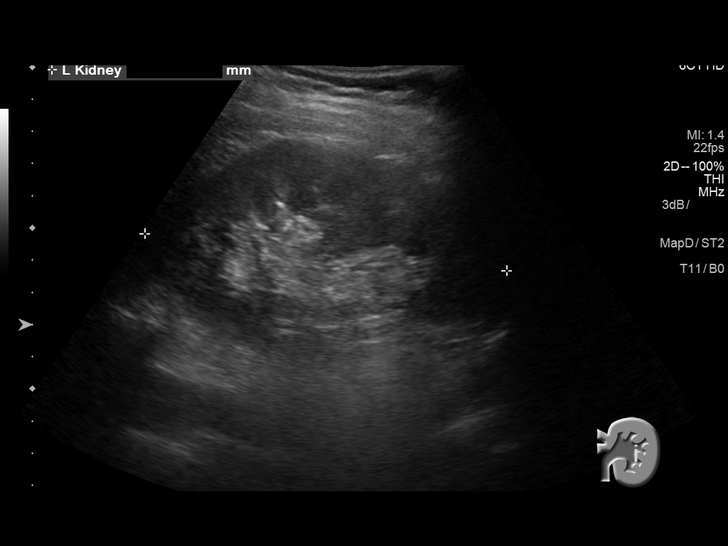
[im 76/91]
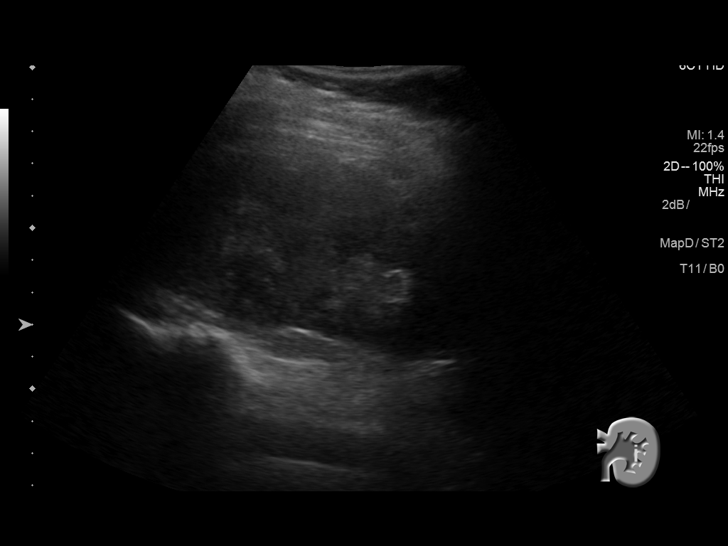
[im 83/91]
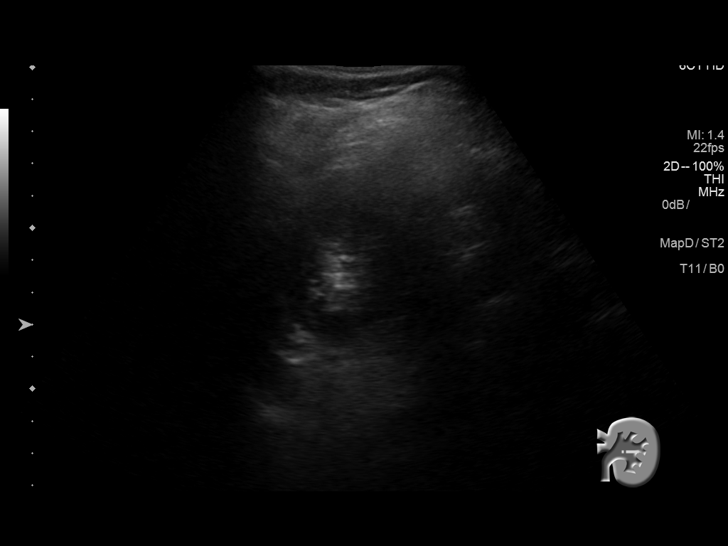
[im 91/91]
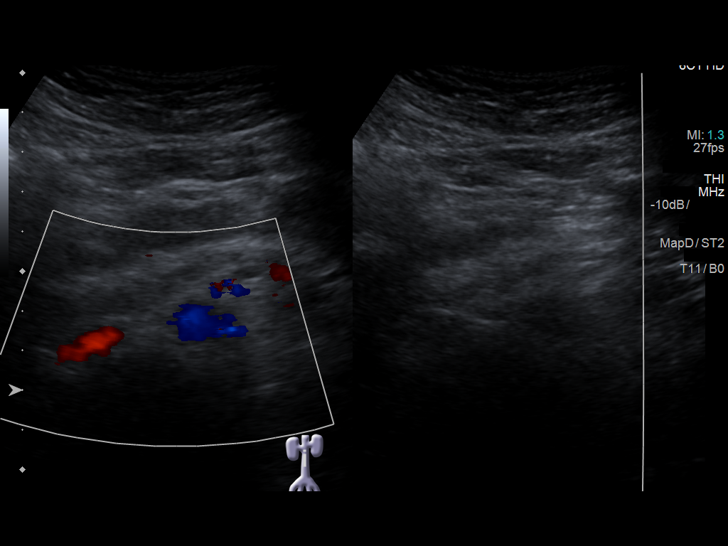

[14 of 25 positions shown; findings below may reference images not displayed]

FINDINGS: Gallbladder: No gallstones or wall thickening visualized. No
sonographic Murphy sign noted by sonographer.

Common bile duct: Diameter: 4 mm which is within normal limits.

Liver: No focal lesion identified. Within normal limits in
parenchymal echogenicity. Portal vein is patent on color Doppler
imaging with normal direction of blood flow towards the liver.

IVC: No abnormality visualized.

Pancreas: Visualized portion unremarkable.

Spleen: Size and appearance within normal limits.

Right Kidney: Length: 10.8 cm. Echogenicity within normal limits. No
mass or hydronephrosis visualized.

Left Kidney: Length: 11.7 cm. Echogenicity within normal limits. No
mass or hydronephrosis visualized.

Abdominal aorta: No aneurysm visualized.

Other findings: None.
IMPRESSION: No definite abnormality seen in the abdomen.

## 2018-01-31 ENCOUNTER — Inpatient Hospital Stay (HOSPITAL_COMMUNITY): Payer: BC Managed Care – PPO

## 2018-01-31 ENCOUNTER — Encounter (HOSPITAL_COMMUNITY): Payer: Self-pay | Admitting: Emergency Medicine

## 2018-01-31 ENCOUNTER — Inpatient Hospital Stay (HOSPITAL_COMMUNITY)
Admission: EM | Admit: 2018-01-31 | Discharge: 2018-02-02 | DRG: 639 | Disposition: A | Discharge status: Home / Self Care | Payer: BC Managed Care – PPO | Attending: Internal Medicine | Admitting: Internal Medicine

## 2018-01-31 ENCOUNTER — Other Ambulatory Visit: Payer: Self-pay

## 2018-01-31 ENCOUNTER — Emergency Department (HOSPITAL_COMMUNITY): Payer: BC Managed Care – PPO

## 2018-01-31 DIAGNOSIS — Z23 Encounter for immunization: Secondary | ICD-10-CM

## 2018-01-31 DIAGNOSIS — E1010 Type 1 diabetes mellitus with ketoacidosis without coma: Secondary | ICD-10-CM | POA: Diagnosis present

## 2018-01-31 DIAGNOSIS — E860 Dehydration: Secondary | ICD-10-CM | POA: Diagnosis present

## 2018-01-31 DIAGNOSIS — Z720 Tobacco use: Secondary | ICD-10-CM

## 2018-01-31 DIAGNOSIS — F17210 Nicotine dependence, cigarettes, uncomplicated: Secondary | ICD-10-CM | POA: Diagnosis present

## 2018-01-31 DIAGNOSIS — E1065 Type 1 diabetes mellitus with hyperglycemia: Secondary | ICD-10-CM | POA: Diagnosis not present

## 2018-01-31 DIAGNOSIS — F329 Major depressive disorder, single episode, unspecified: Secondary | ICD-10-CM | POA: Diagnosis present

## 2018-01-31 DIAGNOSIS — J209 Acute bronchitis, unspecified: Secondary | ICD-10-CM | POA: Diagnosis present

## 2018-01-31 DIAGNOSIS — E1110 Type 2 diabetes mellitus with ketoacidosis without coma: Secondary | ICD-10-CM | POA: Diagnosis present

## 2018-01-31 DIAGNOSIS — Z833 Family history of diabetes mellitus: Secondary | ICD-10-CM | POA: Diagnosis not present

## 2018-01-31 LAB — COMPREHENSIVE METABOLIC PANEL
ALBUMIN: 4.7 g/dL (ref 3.5–5.0)
ALT: 23 U/L (ref 0–44)
ANION GAP: 17 — AB (ref 5–15)
AST: 21 U/L (ref 15–41)
Alkaline Phosphatase: 92 U/L (ref 38–126)
BILIRUBIN TOTAL: 1.7 mg/dL — AB (ref 0.3–1.2)
BUN: 21 mg/dL — AB (ref 6–20)
CALCIUM: 9 mg/dL (ref 8.9–10.3)
CO2: 13 mmol/L — AB (ref 22–32)
CREATININE: 1.04 mg/dL — AB (ref 0.44–1.00)
Chloride: 102 mmol/L (ref 98–111)
GLUCOSE: 354 mg/dL — AB (ref 70–99)
POTASSIUM: 3.8 mmol/L (ref 3.5–5.1)
Sodium: 132 mmol/L — ABNORMAL LOW (ref 135–145)
Total Protein: 8.9 g/dL — ABNORMAL HIGH (ref 6.5–8.1)

## 2018-01-31 LAB — BILIRUBIN, FRACTIONATED(TOT/DIR/INDIR)
BILIRUBIN TOTAL: 1.2 mg/dL (ref 0.3–1.2)
Bilirubin, Direct: 0.1 mg/dL (ref 0.0–0.2)
Indirect Bilirubin: 1.1 mg/dL — ABNORMAL HIGH (ref 0.3–0.9)

## 2018-01-31 LAB — CBC WITH DIFFERENTIAL/PLATELET
ABS IMMATURE GRANULOCYTES: 0.14 10*3/uL — AB (ref 0.00–0.07)
BASOS ABS: 0.1 10*3/uL (ref 0.0–0.1)
Basophils Relative: 0 %
EOS ABS: 0 10*3/uL (ref 0.0–0.5)
EOS PCT: 0 %
HEMATOCRIT: 49.3 % — AB (ref 36.0–46.0)
HEMOGLOBIN: 15.8 g/dL — AB (ref 12.0–15.0)
Immature Granulocytes: 1 %
LYMPHS ABS: 1.6 10*3/uL (ref 0.7–4.0)
LYMPHS PCT: 6 %
MCH: 29.4 pg (ref 26.0–34.0)
MCHC: 32 g/dL (ref 30.0–36.0)
MCV: 91.8 fL (ref 80.0–100.0)
MONO ABS: 0.9 10*3/uL (ref 0.1–1.0)
MONOS PCT: 4 %
NRBC: 0 % (ref 0.0–0.2)
Neutro Abs: 22.7 10*3/uL — ABNORMAL HIGH (ref 1.7–7.7)
Neutrophils Relative %: 89 %
Platelets: 304 10*3/uL (ref 150–400)
RBC: 5.37 MIL/uL — ABNORMAL HIGH (ref 3.87–5.11)
RDW: 11.9 % (ref 11.5–15.5)
WBC Morphology: INCREASED
WBC: 25.5 10*3/uL — ABNORMAL HIGH (ref 4.0–10.5)

## 2018-01-31 LAB — BLOOD GAS, VENOUS
ACID-BASE DEFICIT: 13.8 mmol/L — AB (ref 0.0–2.0)
BICARBONATE: 12.7 mmol/L — AB (ref 20.0–28.0)
FIO2: 0.21
O2 Saturation: 47.9 %
PATIENT TEMPERATURE: 37
PCO2 VEN: 35.1 mmHg — AB (ref 44.0–60.0)
pH, Ven: 7.186 — CL (ref 7.250–7.430)

## 2018-01-31 LAB — GLUCOSE, CAPILLARY
GLUCOSE-CAPILLARY: 115 mg/dL — AB (ref 70–99)
GLUCOSE-CAPILLARY: 163 mg/dL — AB (ref 70–99)
GLUCOSE-CAPILLARY: 198 mg/dL — AB (ref 70–99)
GLUCOSE-CAPILLARY: 264 mg/dL — AB (ref 70–99)
Glucose-Capillary: 221 mg/dL — ABNORMAL HIGH (ref 70–99)
Glucose-Capillary: 146 mg/dL — ABNORMAL HIGH (ref 70–99)
Glucose-Capillary: 116 mg/dL — ABNORMAL HIGH (ref 70–99)

## 2018-01-31 LAB — BASIC METABOLIC PANEL
ANION GAP: 14 (ref 5–15)
Anion gap: 11 (ref 5–15)
BUN: 19 mg/dL (ref 6–20)
BUN: 15 mg/dL (ref 6–20)
CALCIUM: 8 mg/dL — AB (ref 8.9–10.3)
CHLORIDE: 110 mmol/L (ref 98–111)
CO2: 13 mmol/L — ABNORMAL LOW (ref 22–32)
CO2: 14 mmol/L — AB (ref 22–32)
CREATININE: 0.74 mg/dL (ref 0.44–1.00)
Calcium: 8.5 mg/dL — ABNORMAL LOW (ref 8.9–10.3)
Chloride: 108 mmol/L (ref 98–111)
Creatinine, Ser: 0.77 mg/dL (ref 0.44–1.00)
GFR calc Af Amer: >60 mL/min (ref >60)
GFR calc Af Amer: >60 mL/min (ref >60)
GFR calc non Af Amer: >60 mL/min (ref >60)
GLUCOSE: 254 mg/dL — AB (ref 70–99)
Glucose, Bld: 222 mg/dL — ABNORMAL HIGH (ref 70–99)
POTASSIUM: 4.4 mmol/L (ref 3.5–5.1)
Potassium: 4.3 mmol/L (ref 3.5–5.1)
SODIUM: 135 mmol/L (ref 135–145)
Sodium: 135 mmol/L (ref 135–145)

## 2018-01-31 LAB — CBG MONITORING, ED
GLUCOSE-CAPILLARY: 425 mg/dL — AB (ref 70–99)
Glucose-Capillary: 216 mg/dL — ABNORMAL HIGH (ref 70–99)
Glucose-Capillary: 200 mg/dL — ABNORMAL HIGH (ref 70–99)

## 2018-01-31 LAB — URINALYSIS, ROUTINE W REFLEX MICROSCOPIC
Bacteria, UA: NONE SEEN
Bilirubin Urine: NEGATIVE
Hgb urine dipstick: NEGATIVE
KETONES UR: 80 mg/dL — AB
Leukocytes, UA: NEGATIVE
Nitrite: NEGATIVE
PH: 5 (ref 5.0–8.0)
PROTEIN: NEGATIVE mg/dL
Specific Gravity, Urine: 1.027 (ref 1.005–1.030)

## 2018-01-31 LAB — MRSA PCR SCREENING: MRSA BY PCR: NEGATIVE

## 2018-01-31 LAB — LIPASE, BLOOD: LIPASE: 28 U/L (ref 11–51)

## 2018-01-31 LAB — INFLUENZA PANEL BY PCR (TYPE A & B)
INFLBPCR: NEGATIVE
Influenza A By PCR: NEGATIVE

## 2018-01-31 LAB — I-STAT BETA HCG BLOOD, ED (MC, WL, AP ONLY): I-stat hCG, quantitative: <5 m[IU]/mL (ref <5)

## 2018-01-31 LAB — PROCALCITONIN: Procalcitonin: 7.36 ng/mL

## 2018-01-31 MED ORDER — ENOXAPARIN SODIUM 40 MG/0.4ML ~~LOC~~ SOLN
40.0000 mg | SUBCUTANEOUS | Status: DC
  Administered 2018-01-31 – 2018-02-01 (×2): 40 mg via SUBCUTANEOUS
  Filled 2018-01-31 (×2): qty 0.4

## 2018-01-31 MED ORDER — ESCITALOPRAM OXALATE 10 MG PO TABS
10.0000 mg | ORAL_TABLET | Freq: Every day | ORAL | Status: DC
  Administered 2018-02-01 – 2018-02-02 (×2): 10 mg via ORAL
  Filled 2018-01-31 (×2): qty 1

## 2018-01-31 MED ORDER — INSULIN REGULAR(HUMAN) IN NACL 100-0.9 UT/100ML-% IV SOLN
INTRAVENOUS | Status: DC
  Administered 2018-01-31: 100 [IU] via INTRAVENOUS
  Filled 2018-01-31 (×2): qty 100

## 2018-01-31 MED ORDER — INSULIN REGULAR(HUMAN) IN NACL 100-0.9 UT/100ML-% IV SOLN
INTRAVENOUS | Status: DC
  Filled 2018-01-31: qty 100

## 2018-01-31 MED ORDER — SODIUM CHLORIDE 0.9 % IV SOLN: INTRAVENOUS | Status: AC

## 2018-01-31 MED ORDER — POTASSIUM CHLORIDE 10 MEQ/100ML IV SOLN
10.0000 meq | INTRAVENOUS | Status: AC
  Administered 2018-01-31 (×2): 10 meq via INTRAVENOUS
  Filled 2018-01-31: qty 100

## 2018-01-31 MED ORDER — SODIUM CHLORIDE 0.9 % IV BOLUS
2000.0000 mL | Freq: Once | INTRAVENOUS | Status: AC
  Administered 2018-01-31: 2000 mL via INTRAVENOUS

## 2018-01-31 MED ORDER — POTASSIUM CHLORIDE 10 MEQ/100ML IV SOLN
10.0000 meq | INTRAVENOUS | Status: AC
  Filled 2018-01-31: qty 100

## 2018-01-31 MED ORDER — SODIUM CHLORIDE 0.9 % IV SOLN: INTRAVENOUS | Status: DC

## 2018-01-31 MED ORDER — DEXTROSE-NACL 5-0.45 % IV SOLN
INTRAVENOUS | Status: DC
  Administered 2018-01-31 – 2018-02-01 (×3): via INTRAVENOUS
  Administered 2018-02-01: 125 mL/h via INTRAVENOUS

## 2018-01-31 NOTE — ED Triage Notes (Signed)
Patient complaining of blood sugar of 490 at home. States she has insulin pump and can't get sugar to come down. Complains of dizziness, generalized weakness, vomiting x 1, and palpitations.

## 2018-01-31 NOTE — ED Provider Notes (Signed)
[facility] EMERGENCY DEPARTMENT Provider Note   CSN: [id number] Arrival date & time: 01/31/18  1058     History   Chief Complaint Chief Complaint  Patient presents with  . Hyperglycemia    HPI Norma Allen is a 32 y.o. female.  HPI complains of generalized malaise, diffuse myalgias.  She vomited one time last night.  She reports that her blood sugar has been greater than 400 since yesterday.  She denies fever.  Maximum temperature 99 degrees.  She does admit to cold and cough symptoms over the past week which have improved.  Other symptoms include generalized malaise and feeling of dehydration.  Thing makes symptoms better or worse.  No treatment prior to coming here.  She is chronically on an insulin pump  Past Medical History:  Diagnosis Date  . Depression    meds 3 years ago  . Diabetes mellitus type I (HCC)   . DKA (diabetic ketoacidoses) (HCC)   . Hx of varicella   . SVD (spontaneous vaginal delivery) 10/08/2014    Patient Active Problem List   Diagnosis Date Noted  . SVD (spontaneous vaginal delivery) 10/08/2014  . Pre-eclampsia 10/07/2014  . DKA (diabetic ketoacidoses) (HCC) 06/23/2013  . Hyperkalemia 06/23/2013  . Hyponatremia 06/23/2013  . Tachycardia 06/23/2013  . Nausea 06/23/2013  . Diabetes mellitus type I (HCC)   . Depression     Past Surgical History:  Procedure Laterality Date  . WISDOM TOOTH EXTRACTION       OB History    Gravida  1   Para  1   Term  1   Preterm      AB      Living  1     SAB      TAB      Ectopic      Multiple  0   Live Births  1            Home Medications    Prior to Admission medications   Medication Sig Start Date End Date Taking? Authorizing Provider  NOVOLOG 100 UNIT/ML injection Inject 15 Units into the skin 3 (three) times daily with meals.  05/26/13  Yes [provider]  sertraline (ZOLOFT) 50 MG tablet Take 1 tablet by mouth daily. 12/24/15   [provider]    Family  History Family History  Problem Relation Age of Onset  . Cancer Mother   . Hypertension Mother   . Diabetes Father   . Hypertension Father     Social History Social History   Tobacco Use  . Smoking status: Former Smoker    Packs/day: 0.50    Years: 4.00    Pack years: 2.00    Types: Cigarettes    Last attempt to quit: 03/07/2014    Years since quitting: 3.9  . Smokeless tobacco: Never Used  Substance Use Topics  . Alcohol use: No  . Drug use: No     Allergies   Patient has no known allergies.   Review of Systems Review of Systems  Constitutional: Negative.        Generalized malaise  HENT: Positive for congestion.   Respiratory: Positive for cough.   Cardiovascular: Negative.   Gastrointestinal: Positive for vomiting.  Musculoskeletal: Negative.   Skin: Negative.   Allergic/Immunologic: Positive for immunocompromised state.  Neurological: Positive for light-headedness.  Psychiatric/Behavioral: Negative.      Physical Exam Updated Vital Signs BP (!) 118/55 (BP Location: Right Arm)   Pulse (!) 119  Temp 98 F (36.7 C) (Oral)   Resp 18   LMP 01/05/2018   SpO2 100%   Physical Exam  Constitutional: She appears well-developed and well-nourished. No distress.  HENT:  Head: Normocephalic and atraumatic.  Mucous membranes dry  Eyes: Pupils are equal, round, and reactive to light. Conjunctivae are normal.  Neck: Neck supple. No tracheal deviation present. No thyromegaly present.  Cardiovascular: Normal rate and regular rhythm.  No murmur heard. Pulmonary/Chest: Effort normal and breath sounds normal.  Abdominal: Soft. Bowel sounds are normal. She exhibits no distension. There is no tenderness.  Musculoskeletal: Normal range of motion. She exhibits no edema or tenderness.  Neurological: She is alert. Coordination normal.  Skin: Skin is warm and dry. No rash noted.  Psychiatric: She has a normal mood and affect. Her behavior is normal.  Nursing note and  vitals reviewed.    ED Treatments / Results  Labs (all labs ordered are listed, but only abnormal results are displayed) Labs Reviewed  URINALYSIS, ROUTINE W REFLEX MICROSCOPIC  COMPREHENSIVE METABOLIC PANEL  CBC WITH DIFFERENTIAL/PLATELET  BLOOD GAS, VENOUS  I-STAT BETA HCG BLOOD, ED ([facility], [facility], [facility] ONLY)    EKG None  Radiology No results found.  Procedures Procedures (including critical care time)  Medications Ordered in ED Medications  sodium chloride 0.9 % bolus 2,000 mL (has no administration in time range)   Results for orders placed or performed during the hospital encounter of 01/31/18  Urinalysis, Routine w reflex microscopic  Result Value Ref Range   Color, Urine STRAW (A) YELLOW   APPearance CLEAR CLEAR   Specific Gravity, Urine 1.027 1.005 - 1.030   pH 5.0 5.0 - 8.0   Glucose, UA >=500 (A) NEGATIVE mg/dL   Hgb urine dipstick NEGATIVE NEGATIVE   Bilirubin Urine NEGATIVE NEGATIVE   Ketones, ur 80 (A) NEGATIVE mg/dL   Protein, ur NEGATIVE NEGATIVE mg/dL   Nitrite NEGATIVE NEGATIVE   Leukocytes, UA NEGATIVE NEGATIVE   RBC / HPF 0-5 0 - 5 RBC/hpf   WBC, UA 0-5 0 - 5 WBC/hpf   Bacteria, UA NONE SEEN NONE SEEN   Squamous Epithelial / LPF 0-5 0 - 5   Mucus PRESENT   Comprehensive metabolic panel  Result Value Ref Range   Sodium 132 (L) 135 - 145 mmol/L   Potassium 3.8 3.5 - 5.1 mmol/L   Chloride 102 98 - 111 mmol/L   CO2 13 (L) 22 - 32 mmol/L   Glucose, Bld 354 (H) 70 - 99 mg/dL   BUN 21 (H) 6 - 20 mg/dL   Creatinine, Ser 5.78 (H) 0.44 - 1.00 mg/dL   Calcium 9.0 8.9 - 46.9 mg/dL   Total Protein 8.9 (H) 6.5 - 8.1 g/dL   Albumin 4.7 3.5 - 5.0 g/dL   AST 21 15 - 41 U/L   ALT 23 0 - 44 U/L   Alkaline Phosphatase 92 38 - 126 U/L   Total Bilirubin 1.7 (H) 0.3 - 1.2 mg/dL   GFR calc non Af Amer >60 >60 mL/min   GFR calc Af Amer >60 >60 mL/min   Anion gap 17 (H) 5 - 15  CBC with Differential/Platelet  Result Value Ref Range   WBC 25.5 (H) 4.0 - 10.5 K/uL    RBC 5.37 (H) 3.87 - 5.11 MIL/uL   Hemoglobin 15.8 (H) 12.0 - 15.0 g/dL   HCT 62.9 (H) 52.8 - 41.3 %   MCV 91.8 80.0 - 100.0 fL   MCH 29.4 26.0 - 34.0 pg  MCHC 32.0 30.0 - 36.0 g/dL   RDW 16.1 09.6 - 04.5 %   Platelets 304 150 - 400 K/uL   nRBC 0.0 0.0 - 0.2 %   Neutrophils Relative % 89 %   Neutro Abs 22.7 (H) 1.7 - 7.7 K/uL   Lymphocytes Relative 6 %   Lymphs Abs 1.6 0.7 - 4.0 K/uL   Monocytes Relative 4 %   Monocytes Absolute 0.9 0.1 - 1.0 K/uL   Eosinophils Relative 0 %   Eosinophils Absolute 0.0 0.0 - 0.5 K/uL   Basophils Relative 0 %   Basophils Absolute 0.1 0.0 - 0.1 K/uL   WBC Morphology INCREASED BANDS (>20% BANDS)    Immature Granulocytes 1 %   Abs Immature Granulocytes 0.14 (H) 0.00 - 0.07 K/uL  Blood gas, venous  Result Value Ref Range   FIO2 0.21    pH, Ven 7.186 (LL) 7.250 - 7.430   pCO2, Ven 35.1 (L) 44.0 - 60.0 mmHg   Bicarbonate 12.7 (L) 20.0 - 28.0 mmol/L   Acid-base deficit 13.8 (H) 0.0 - 2.0 mmol/L   O2 Saturation 47.9 %   Patient temperature 37.0    Collection site VENOUS    Drawn by DRAWN BY RN    Sample type VENOUS   I-Stat Beta hCG blood, ED ([facility], [facility], [facility] only)  Result Value Ref Range   I-stat hCG, quantitative <5.0 <5 mIU/mL   Comment 3          CBG monitoring, ED  Result Value Ref Range   Glucose-Capillary 425 (H) 70 - 99 mg/dL   Dg Chest 2 View  Result Date: 01/31/2018 CLINICAL DATA:  Cough.  Hyperglycemia EXAM: CHEST - 2 VIEW COMPARISON:  February 11, 2016 FINDINGS: No edema or consolidation. Heart size and pulmonary vascularity are normal. No adenopathy. No bone lesions. IMPRESSION: No edema or consolidation. Electronically Signed   By: Bretta Bang III M.D.   On: 01/31/2018 13:33    Initial Impression / Assessment and Plan / ED Course  I have reviewed the triage vital signs and the nursing notes.  Pertinent labs & imaging results that were available during my care of the patient were reviewed by me and considered in my medical  decision making (see chart for details).     Chest x-ray viewed by me.  Lab work consistent with diabetic ketoacidosis.  Intravenous insulin drip as well as intravenous normal saline bolus and intravenous normal saline drip ordered by me, Hospitalist service Dr.Tat consulted to arrange for overnight stay   2 PM patient resting comfortably after treatment with intravenous fluids.  Appears in no distress. Results for orders placed or performed during the hospital encounter of 01/31/18  Urinalysis, Routine w reflex microscopic  Result Value Ref Range   Color, Urine STRAW (A) YELLOW   APPearance CLEAR CLEAR   Specific Gravity, Urine 1.027 1.005 - 1.030   pH 5.0 5.0 - 8.0   Glucose, UA >=500 (A) NEGATIVE mg/dL   Hgb urine dipstick NEGATIVE NEGATIVE   Bilirubin Urine NEGATIVE NEGATIVE   Ketones, ur 80 (A) NEGATIVE mg/dL   Protein, ur NEGATIVE NEGATIVE mg/dL   Nitrite NEGATIVE NEGATIVE   Leukocytes, UA NEGATIVE NEGATIVE   RBC / HPF 0-5 0 - 5 RBC/hpf   WBC, UA 0-5 0 - 5 WBC/hpf   Bacteria, UA NONE SEEN NONE SEEN   Squamous Epithelial / LPF 0-5 0 - 5   Mucus PRESENT   Comprehensive metabolic panel  Result Value Ref Range  Sodium 132 (L) 135 - 145 mmol/L   Potassium 3.8 3.5 - 5.1 mmol/L   Chloride 102 98 - 111 mmol/L   CO2 13 (L) 22 - 32 mmol/L   Glucose, Bld 354 (H) 70 - 99 mg/dL   BUN 21 (H) 6 - 20 mg/dL   Creatinine, Ser 1.61 (H) 0.44 - 1.00 mg/dL   Calcium 9.0 8.9 - 09.6 mg/dL   Total Protein 8.9 (H) 6.5 - 8.1 g/dL   Albumin 4.7 3.5 - 5.0 g/dL   AST 21 15 - 41 U/L   ALT 23 0 - 44 U/L   Alkaline Phosphatase 92 38 - 126 U/L   Total Bilirubin 1.7 (H) 0.3 - 1.2 mg/dL   GFR calc non Af Amer >60 >60 mL/min   GFR calc Af Amer >60 >60 mL/min   Anion gap 17 (H) 5 - 15  CBC with Differential/Platelet  Result Value Ref Range   WBC 25.5 (H) 4.0 - 10.5 K/uL   RBC 5.37 (H) 3.87 - 5.11 MIL/uL   Hemoglobin 15.8 (H) 12.0 - 15.0 g/dL   HCT 04.5 (H) 40.9 - 81.1 %   MCV 91.8 80.0 -  100.0 fL   MCH 29.4 26.0 - 34.0 pg   MCHC 32.0 30.0 - 36.0 g/dL   RDW 91.4 78.2 - 95.6 %   Platelets 304 150 - 400 K/uL   nRBC 0.0 0.0 - 0.2 %   Neutrophils Relative % 89 %   Neutro Abs 22.7 (H) 1.7 - 7.7 K/uL   Lymphocytes Relative 6 %   Lymphs Abs 1.6 0.7 - 4.0 K/uL   Monocytes Relative 4 %   Monocytes Absolute 0.9 0.1 - 1.0 K/uL   Eosinophils Relative 0 %   Eosinophils Absolute 0.0 0.0 - 0.5 K/uL   Basophils Relative 0 %   Basophils Absolute 0.1 0.0 - 0.1 K/uL   WBC Morphology INCREASED BANDS (>20% BANDS)    Immature Granulocytes 1 %   Abs Immature Granulocytes 0.14 (H) 0.00 - 0.07 K/uL  Blood gas, venous  Result Value Ref Range   FIO2 0.21    pH, Ven 7.186 (LL) 7.250 - 7.430   pCO2, Ven 35.1 (L) 44.0 - 60.0 mmHg   Bicarbonate 12.7 (L) 20.0 - 28.0 mmol/L   Acid-base deficit 13.8 (H) 0.0 - 2.0 mmol/L   O2 Saturation 47.9 %   Patient temperature 37.0    Collection site VENOUS    Drawn by DRAWN BY RN    Sample type VENOUS   I-Stat Beta hCG blood, ED ([facility], [facility], [facility] only)  Result Value Ref Range   I-stat hCG, quantitative <5.0 <5 mIU/mL   Comment 3          CBG monitoring, ED  Result Value Ref Range   Glucose-Capillary 425 (H) 70 - 99 mg/dL   Dg Chest 2 View  Result Date: 01/31/2018 CLINICAL DATA:  Cough.  Hyperglycemia EXAM: CHEST - 2 VIEW COMPARISON:  February 11, 2016 FINDINGS: No edema or consolidation. Heart size and pulmonary vascularity are normal. No adenopathy. No bone lesions. IMPRESSION: No edema or consolidation. Electronically Signed   By: Bretta Bang III M.D.   On: 01/31/2018 13:33   Final Clinical Impressions(s) / ED Diagnoses  Diagnoses #1 diabetic ketoacidosis #2 dehydration CRITICAL CARE Performed by: Doug Sou Total critical care time: 30 minutes Critical care time was exclusive of separately billable procedures and treating other patients. Critical care was necessary to treat or prevent imminent or life-threatening deterioration. Critical  care was time spent personally by me on the following activities: development of treatment plan with patient and/or surrogate as well as nursing, discussions with consultants, evaluation of patient's response to treatment, examination of patient, obtaining history from patient or surrogate, ordering and performing treatments and interventions, ordering and review of laboratory studies, ordering and review of radiographic studies, pulse oximetry and re-evaluation of patient's condition. Final diagnoses:  None    ED Discharge Orders    None       Doug Sou, MD 01/31/18 1404

## 2018-01-31 NOTE — ED Notes (Signed)
Patient transported to X-ray 

## 2018-01-31 NOTE — H&P (Signed)
History and Physical  Norma Allen OZH:[id number] DOB: April 12, 1986 DOA: 01/31/2018   PCP: Benita Stabile, MD   Patient coming from: Home  Chief Complaint: n/v generalized weakness, high CBGs  HPI:  Norma Allen is a 32 y.o. female with medical history of diabetes mellitus type 1, depression presenting with 1 to 2-day history of generalized weakness and high blood sugars.  The patient has been diagnosed with diabetes mellitus type I since she was age 45.  She is currently using an insulin pump with which she has been compliant.  On 01/31/2018, the patient noted her blood sugars to be as high as 507.  She bolused herself several times on the insulin pump, and her blood sugars never went below 470.  The patient began having some nausea and vomiting with generalized weakness.  As result, the patient presented for further evaluation.  The patient has been rotating her sites on her insulin pump.  She denies any fevers, chills, dysuria, hematuria, headache, neck pain.  She has been having some upper abdominal discomfort starting last 24 hours without any hematemesis, hematochezia, melena.  The patient smokes 4 to 5 cigarettes/day, but she denies any illegal drug use or alcohol.  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  She denies any other travel or sick contacts.  She denies any changes in her medications.  Notably, the patient states that she has been having chest congestion and coughing for the better part of 1 month.  She was given a Z-Pak by her primary care provider in mid-September, but her symptoms have continued. In the emergency department, the patient was afebrile hemodynamically stable and saturating 100% on room air.  She was tachycardic up into the 120s.  WBC is 25.5.  BMP showed sodium 132 with serum creatinine 1.04.  LFTs were unremarkable.  Anion gap was 17 with ketonuria.  Chest x-ray was negative.  The patient was started on an insulin IV drip and given 2 L fluid  bolus.  Assessment/Plan: DKA -patient started on IV insulin with q 1 hour CBG check and q 4 hour BMPs -pt started on aggressive fluid resuscitation -Electrolytes were monitored and repleted -transitioned to [place] insulin once anion gap closed -diet was advanced once anion gap closed -HbA1C--  Acute Bronchitis -check influenza pcr -viral respiratory panel  Leukocytosis -Likely due to stress demargination -Blood cultures x2 sets -Check procalcitonin -CXR  Hyperbilirubinemia -Right upper quadrant ultrasound -Fractionate bilirubin         Past Medical History:  Diagnosis Date  . Depression    meds 3 years ago  . Diabetes mellitus type I (HCC)   . DKA (diabetic ketoacidoses) (HCC)   . Hx of varicella   . SVD (spontaneous vaginal delivery) 10/08/2014   Past Surgical History:  Procedure Laterality Date  . WISDOM TOOTH EXTRACTION     Social History:  reports that she quit smoking about 3 years ago. Her smoking use included cigarettes. She has a 2.00 pack-year smoking history. She has never used smokeless tobacco. She reports that she does not drink alcohol or use drugs.   Family History  Problem Relation Age of Onset  . Cancer Mother   . Hypertension Mother   . Diabetes Father   . Hypertension Father      No Known Allergies   Prior to Admission medications   Medication Sig Start Date End Date Taking? Authorizing Provider  cetirizine (ZYRTEC) 10 MG tablet Take 1 tablet by mouth  daily. 03/04/17  Yes [provider]  escitalopram (LEXAPRO) 10 MG tablet Take 10 mg by mouth daily.   Yes [provider]  fluticasone (FLONASE) 50 MCG/ACT nasal spray Place 2 sprays into both nostrils 2 (two) times daily. 03/04/17  Yes [provider]  NOVOLOG 100 UNIT/ML injection Inject 15 Units into the skin 3 (three) times daily with meals.  05/26/13  Yes [provider]    Review of Systems:  Constitutional:  No weight loss, night sweats, Fevers,  chills, fatigue.  Head&Eyes: No headache.  No vision loss.  No eye pain or scotoma ENT:  No Difficulty swallowing,Tooth/dental problems,Sore throat,  No ear ache, post nasal drip,  Cardio-vascular:  No chest pain, Orthopnea, PND, swelling in lower extremities,  dizziness, palpitations  GI:  No  abdominal pain, nausea, vomiting, diarrhea, loss of appetite, hematochezia, melena, heartburn, indigestion, Resp:  No shortness of breath with exertion or at rest. No cough. No coughing up of blood .No wheezing.No chest wall deformity  Skin:  no rash or lesions.  GU:  no dysuria, change in color of urine, no urgency or frequency. No flank pain.  Musculoskeletal:  No joint pain or swelling. No decreased range of motion. No back pain.  Psych:  No change in mood or affect. No depression or anxiety. Neurologic: No headache, no dysesthesia, no focal weakness, no vision loss. No syncope  Physical Exam: Vitals:   01/31/18 1109 01/31/18 1344  BP: (!) 118/55   Pulse: (!) 119   Resp: 18   Temp: 98 F (36.7 C)   TempSrc: Oral   SpO2: 100%   Weight:  75.8 kg  Height:  5\' 6"  (1.676 m)   General:  A&O x 3, NAD, nontoxic, pleasant/cooperative Head/Eye: No conjunctival hemorrhage, no icterus, [place]/AT, No nystagmus ENT:  No icterus,  No thrush, good dentition, no pharyngeal exudate Neck:  No masses, no lymphadenpathy, no bruits CV:  RRR, no rub, no gallop, no S3 Lung:  CTAB, good air movement, no wheeze, no rhonchi Abdomen: soft/NT, +BS, nondistended, no peritoneal signs Ext: No cyanosis, No rashes, No petechiae, No lymphangitis, No edema Neuro: CNII-XII intact, strength 4/5 in bilateral upper and lower extremities, no dysmetria  Labs on Admission:  Basic Metabolic Panel: Recent Labs  Lab 01/31/18 1209  NA 132*  K 3.8  CL 102  CO2 13*  GLUCOSE 354*  BUN 21*  CREATININE 1.04*  CALCIUM 9.0   Liver Function Tests: Recent Labs  Lab 01/31/18 1209  AST 21  ALT 23  ALKPHOS 92  BILITOT  1.7*  PROT 8.9*  ALBUMIN 4.7   No results for input(s): LIPASE, AMYLASE in the last 168 hours. No results for input(s): AMMONIA in the last 168 hours. CBC: Recent Labs  Lab 01/31/18 1209  WBC 25.5*  NEUTROABS 22.7*  HGB 15.8*  HCT 49.3*  MCV 91.8  PLT 304   Coagulation Profile: No results for input(s): INR, PROTIME in the last 168 hours. Cardiac Enzymes: No results for input(s): CKTOTAL, CKMB, CKMBINDEX, TROPONINI in the last 168 hours. BNP: Invalid input(s): POCBNP CBG: Recent Labs  Lab 01/31/18 1115  GLUCAP 425*   Urine analysis:    Component Value Date/Time   COLORURINE STRAW (A) 01/31/2018 1139   APPEARANCEUR CLEAR 01/31/2018 1139   LABSPEC 1.027 01/31/2018 1139   PHURINE 5.0 01/31/2018 1139   GLUCOSEU >=500 (A) 01/31/2018 1139   HGBUR NEGATIVE 01/31/2018 1139   BILIRUBINUR NEGATIVE 01/31/2018 1139   KETONESUR 80 (A) 01/31/2018 1139  PROTEINUR NEGATIVE 01/31/2018 1139   UROBILINOGEN 1.0 10/07/2014 0945   NITRITE NEGATIVE 01/31/2018 1139   LEUKOCYTESUR NEGATIVE 01/31/2018 1139   Sepsis Labs: @LABRCNTIP (procalcitonin:4,lacticidven:4) )No results found for this or any previous visit (from the past 240 hour(s)).   Radiological Exams on Admission: Dg Chest 2 View  Result Date: 01/31/2018 CLINICAL DATA:  Cough.  Hyperglycemia EXAM: CHEST - 2 VIEW COMPARISON:  February 11, 2016 FINDINGS: No edema or consolidation. Heart size and pulmonary vascularity are normal. No adenopathy. No bone lesions. IMPRESSION: No edema or consolidation. Electronically Signed   By: Bretta Bang III M.D.   On: 01/31/2018 13:33    EKG: Independently reviewed. pending    Time spent:60 minutes Code Status:   FULL Family Communication:  No Family at bedside Disposition Plan: expect 2-3 day hospitalization Consults called: none DVT Prophylaxis: [place] Lovenox  Catarina Hartshorn, DO  [facility] Pager [phone]  If 7PM-7AM, please contact  night-coverage [email or web address] Password [email or web address] 01/31/2018, 2:10 PM

## 2018-02-01 DIAGNOSIS — Z720 Tobacco use: Secondary | ICD-10-CM

## 2018-02-01 LAB — RESPIRATORY PANEL BY PCR
Adenovirus: NOT DETECTED
BORDETELLA PERTUSSIS-RVPCR: NOT DETECTED
CHLAMYDOPHILA PNEUMONIAE-RVPPCR: NOT DETECTED
CORONAVIRUS HKU1-RVPPCR: NOT DETECTED
Coronavirus 229E: NOT DETECTED
Coronavirus NL63: NOT DETECTED
Coronavirus OC43: NOT DETECTED
INFLUENZA A H3-RVPPCR: NOT DETECTED
INFLUENZA A-RVPPCR: NOT DETECTED
Influenza A H1 2009: NOT DETECTED
Influenza A H1: NOT DETECTED
Influenza B: NOT DETECTED
Metapneumovirus: NOT DETECTED
Mycoplasma pneumoniae: NOT DETECTED
PARAINFLUENZA VIRUS 3-RVPPCR: NOT DETECTED
PARAINFLUENZA VIRUS 4-RVPPCR: NOT DETECTED
Parainfluenza Virus 1: NOT DETECTED
Parainfluenza Virus 2: NOT DETECTED
RESPIRATORY SYNCYTIAL VIRUS-RVPPCR: NOT DETECTED
RHINOVIRUS / ENTEROVIRUS - RVPPCR: NOT DETECTED

## 2018-02-01 LAB — CBC
HEMATOCRIT: 39.3 % (ref 36.0–46.0)
HEMOGLOBIN: 13 g/dL (ref 12.0–15.0)
MCH: 30.3 pg (ref 26.0–34.0)
MCHC: 33.1 g/dL (ref 30.0–36.0)
MCV: 91.6 fL (ref 80.0–100.0)
Platelets: 286 10*3/uL (ref 150–400)
RBC: 4.29 MIL/uL (ref 3.87–5.11)
RDW: 12.1 % (ref 11.5–15.5)
WBC: 13 10*3/uL — ABNORMAL HIGH (ref 4.0–10.5)
nRBC: 0 % (ref 0.0–0.2)

## 2018-02-01 LAB — BASIC METABOLIC PANEL
ANION GAP: 7 (ref 5–15)
Anion gap: 7 (ref 5–15)
Anion gap: 12 (ref 5–15)
BUN: 9 mg/dL (ref 6–20)
BUN: 10 mg/dL (ref 6–20)
BUN: 9 mg/dL (ref 6–20)
CHLORIDE: 109 mmol/L (ref 98–111)
CHLORIDE: 111 mmol/L (ref 98–111)
CO2: 19 mmol/L — ABNORMAL LOW (ref 22–32)
CO2: 14 mmol/L — ABNORMAL LOW (ref 22–32)
CO2: 20 mmol/L — ABNORMAL LOW (ref 22–32)
CREATININE: 0.71 mg/dL (ref 0.44–1.00)
Calcium: 8.1 mg/dL — ABNORMAL LOW (ref 8.9–10.3)
Calcium: 8.1 mg/dL — ABNORMAL LOW (ref 8.9–10.3)
Calcium: 8.2 mg/dL — ABNORMAL LOW (ref 8.9–10.3)
Chloride: 110 mmol/L (ref 98–111)
Creatinine, Ser: 0.64 mg/dL (ref 0.44–1.00)
Creatinine, Ser: 0.63 mg/dL (ref 0.44–1.00)
GFR calc Af Amer: >60 mL/min (ref >60)
GFR calc non Af Amer: >60 mL/min (ref >60)
GFR calc non Af Amer: >60 mL/min (ref >60)
GFR calc non Af Amer: >60 mL/min (ref >60)
GLUCOSE: 141 mg/dL — AB (ref 70–99)
GLUCOSE: 289 mg/dL — AB (ref 70–99)
GLUCOSE: 94 mg/dL (ref 70–99)
POTASSIUM: 3.7 mmol/L (ref 3.5–5.1)
Potassium: 3.8 mmol/L (ref 3.5–5.1)
Potassium: 4.1 mmol/L (ref 3.5–5.1)
SODIUM: 137 mmol/L (ref 135–145)
Sodium: 135 mmol/L (ref 135–145)
Sodium: 137 mmol/L (ref 135–145)

## 2018-02-01 LAB — GLUCOSE, CAPILLARY
GLUCOSE-CAPILLARY: 115 mg/dL — AB (ref 70–99)
GLUCOSE-CAPILLARY: 144 mg/dL — AB (ref 70–99)
GLUCOSE-CAPILLARY: 146 mg/dL — AB (ref 70–99)
GLUCOSE-CAPILLARY: 215 mg/dL — AB (ref 70–99)
GLUCOSE-CAPILLARY: 246 mg/dL — AB (ref 70–99)
GLUCOSE-CAPILLARY: 88 mg/dL (ref 70–99)
Glucose-Capillary: 110 mg/dL — ABNORMAL HIGH (ref 70–99)
Glucose-Capillary: 147 mg/dL — ABNORMAL HIGH (ref 70–99)
Glucose-Capillary: 172 mg/dL — ABNORMAL HIGH (ref 70–99)
Glucose-Capillary: 287 mg/dL — ABNORMAL HIGH (ref 70–99)
Glucose-Capillary: 63 mg/dL — ABNORMAL LOW (ref 70–99)
Glucose-Capillary: 76 mg/dL (ref 70–99)
Glucose-Capillary: 91 mg/dL (ref 70–99)
Glucose-Capillary: 95 mg/dL (ref 70–99)

## 2018-02-01 LAB — HIV ANTIBODY (ROUTINE TESTING W REFLEX): HIV SCREEN 4TH GENERATION: NONREACTIVE

## 2018-02-01 LAB — HEPATIC FUNCTION PANEL
ALK PHOS: 61 U/L (ref 38–126)
ALT: 13 U/L (ref 0–44)
AST: 14 U/L — ABNORMAL LOW (ref 15–41)
Albumin: 3.2 g/dL — ABNORMAL LOW (ref 3.5–5.0)
BILIRUBIN DIRECT: 0.2 mg/dL (ref 0.0–0.2)
BILIRUBIN INDIRECT: 1.3 mg/dL — AB (ref 0.3–0.9)
BILIRUBIN TOTAL: 1.5 mg/dL — AB (ref 0.3–1.2)
TOTAL PROTEIN: 6.1 g/dL — AB (ref 6.5–8.1)

## 2018-02-01 LAB — HEMOGLOBIN A1C
Hgb A1c MFr Bld: 7.9 % — ABNORMAL HIGH (ref 4.8–5.6)
Mean Plasma Glucose: 180.03 mg/dL

## 2018-02-01 LAB — PROCALCITONIN: PROCALCITONIN: 4.38 ng/mL

## 2018-02-01 MED ORDER — ACETAMINOPHEN 325 MG PO TABS
650.0000 mg | ORAL_TABLET | Freq: Four times a day (QID) | ORAL | Status: DC | PRN
  Administered 2018-02-01: 650 mg via ORAL
  Filled 2018-02-01: qty 2

## 2018-02-01 MED ORDER — ONDANSETRON HCL 4 MG/2ML IJ SOLN
4.0000 mg | Freq: Four times a day (QID) | INTRAMUSCULAR | Status: DC | PRN
  Administered 2018-02-01 (×2): 4 mg via INTRAVENOUS
  Filled 2018-02-01 (×2): qty 2

## 2018-02-01 MED ORDER — POTASSIUM CHLORIDE IN NACL 20-0.9 MEQ/L-% IV SOLN
INTRAVENOUS | Status: DC
  Administered 2018-02-01: 14:00:00 via INTRAVENOUS
  Administered 2018-02-01 – 2018-02-02 (×2): 125 mL/h via INTRAVENOUS

## 2018-02-01 MED ORDER — INFLUENZA VAC SPLIT QUAD 0.5 ML IM SUSY
0.5000 mL | PREFILLED_SYRINGE | INTRAMUSCULAR | Status: AC
  Administered 2018-02-02: 0.5 mL via INTRAMUSCULAR

## 2018-02-01 MED ORDER — GUAIFENESIN-DM 100-10 MG/5ML PO SYRP
10.0000 mL | ORAL_SOLUTION | ORAL | Status: DC | PRN
  Administered 2018-02-01: 10 mL via ORAL
  Filled 2018-02-01: qty 10

## 2018-02-01 MED ORDER — INSULIN PUMP
Freq: Three times a day (TID) | SUBCUTANEOUS | Status: DC
  Filled 2018-02-01: qty 1

## 2018-02-01 NOTE — Progress Notes (Signed)
PROGRESS NOTE  Norma Allen ZOX:[id number] DOB: 10-12-1985 DOA: 01/31/2018 PCP: Benita Stabile, MD  Brief History:  32 y.o. female with medical history of diabetes mellitus type 1, depression presenting with 1 to 2-day history of generalized weakness and high blood sugars.  The patient has been diagnosed with diabetes mellitus type I since she was age 56.  She is currently using an insulin pump with which she has been compliant.  On 01/31/2018, the patient noted her blood sugars to be as high as 507.  She bolused herself several times on the insulin pump, and her blood sugars never went below 470.  The patient began having some nausea and vomiting with generalized weakness.  As result, the patient presented for further evaluation.  The patient has been rotating her sites on her insulin pump.  She denies any fevers, chills, dysuria, hematuria, headache, neck pain.  She has been having some upper abdominal discomfort starting last 24 hours without any hematemesis, hematochezia, melena.  The patient smokes 4 to 5 cigarettes/day, but she denies any illegal drug use or alcohol.  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    Notably, the patient states that she has been having chest congestion and coughing for the better part of 1 month.  She was given a Z-Pak by her primary care provider in mid-September, but her symptoms have continued. In the emergency department, the patient was afebrile hemodynamically stable and saturating 100% on room air.  She was tachycardic up into the 120s.  WBC is 25.5.  BMP showed sodium 132 with serum creatinine 1.04.  LFTs were unremarkable.  Anion gap was 17 with ketonuria.  Chest x-ray was negative.  The patient was started on an insulin IV drip and given 2 L fluid bolus and continued on IVF.   Assessment/Plan: DKA -patient started on IV insulin with q 1 hour CBG check and q 4 hour BMPs -continue aggressive fluid resuscitation -continue q 4 hour  BMP -transitioned to [place] insulin once anion gap closed--will have pt put her insulin pump back on home settings once gap is improved -diet was advanced once anion gap closed -HbA1C--pending  Acute Bronchitis -check influenza pcr--neg -viral respiratory panel--neg  Leukocytosis -Likely due to stress demargination -Blood cultures x2 sets--neg to date -Check procalcitonin--7.36>>>4.38 -CXR--personally reviewed--no infiltrates or edema -UA neg for pyuria -personally reviewed EKG--sinus, nonspecific T wave changes  Hyperbilirubinemia -Right upper quadrant ultrasound--neg -Fractionate bilirubin--mostly indirect  Tobacco abuse -I have discussed tobacco cessation with the patient.  I have counseled the patient regarding the negative impacts of continued tobacco use including but not limited to lung cancer, COPD, and cardiovascular disease.  I have discussed alternatives to tobacco and modalities that may help facilitate tobacco cessation including but not limited to biofeedback, hypnosis, and medications.  Total time spent with tobacco counseling was 4 minutes.    Disposition Plan:   Home in 1-2 days  Family Communication:   Spouse updated at bedside 10/12  Consultants:  none  Code Status:  FULL  DVT Prophylaxis:   [place] Lovenox   Procedures: As Listed in Progress Note Above  Antibiotics: None    Subjective: Patient denies fevers, chills, headache, chest pain, dyspnea, nausea, vomiting, diarrhea, abdominal pain, dysuria, hematuria, hematochezia, and melena.  She is feeling a little better.  Feeling a little hungry.  abd pain is improved   Objective: Vitals:   02/01/18 0300 02/01/18 0400 02/01/18 0500 02/01/18  0600  BP: 111/64 118/68 (!) 88/51 116/70  Pulse: (!) 102 (!) 102 (!) 111 (!) 104  Resp: 20 20 20  (!) 21  Temp:  98.1 F (36.7 C)    TempSrc:  Oral    SpO2: 98% 99% 97% 98%  Weight:  74.7 kg    Height:        Intake/Output Summary (Last 24 hours) at 02/01/2018  0824 Last data filed at 02/01/2018 1610 Gross per 24 hour  Intake 4113.01 ml  Output -  Net 4113.01 ml   Weight change:  Exam:   General:  Pt is alert, follows commands appropriately, not in acute distress  HEENT: No icterus, No thrush, No neck mass, [place]/AT  Cardiovascular: RRR, S1/S2, no rubs, no gallops  Respiratory: CTA bilaterally, no wheezing, no crackles, no rhonchi  Abdomen: Soft/+BS, non tender, non distended, no guarding  Extremities: No edema, No lymphangitis, No petechiae, No rashes, no synovitis   Data Reviewed: I have personally reviewed following labs and imaging studies Basic Metabolic Panel: Recent Labs  Lab 01/31/18 1209 01/31/18 1529 01/31/18 1805 02/01/18 0052 02/01/18 0445  NA 132* 135 135 137 135  K 3.8 4.4 4.3 3.8 4.1  CL 102 108 110 111 109  CO2 13* 13* 14* 19* 14*  GLUCOSE 354* 222* 254* 94 289*  BUN 21* 19 15 9 10   CREATININE 1.04* 0.77 0.74 0.64 0.71  CALCIUM 9.0 8.5* 8.0* 8.1* 8.1*   Liver Function Tests: Recent Labs  Lab 01/31/18 1209 01/31/18 1805 02/01/18 0445  AST 21  --  14*  ALT 23  --  13  ALKPHOS 92  --  61  BILITOT 1.7* 1.2 1.5*  PROT 8.9*  --  6.1*  ALBUMIN 4.7  --  3.2*   Recent Labs  Lab 01/31/18 1805  LIPASE 28   No results for input(s): AMMONIA in the last 168 hours. Coagulation Profile: No results for input(s): INR, PROTIME in the last 168 hours. CBC: Recent Labs  Lab 01/31/18 1209 02/01/18 0445  WBC 25.5* 13.0*  NEUTROABS 22.7*  --   HGB 15.8* 13.0  HCT 49.3* 39.3  MCV 91.8 91.6  PLT 304 286   Cardiac Enzymes: No results for input(s): CKTOTAL, CKMB, CKMBINDEX, TROPONINI in the last 168 hours. BNP: Invalid input(s): POCBNP CBG: Recent Labs  Lab 02/01/18 0230 02/01/18 0400 02/01/18 0449 02/01/18 0557 02/01/18 0751  GLUCAP 147* 215* 287* 246* 172*   HbA1C: No results for input(s): HGBA1C in the last 72 hours. Urine analysis:    Component Value Date/Time   COLORURINE STRAW (A)  01/31/2018 1139   APPEARANCEUR CLEAR 01/31/2018 1139   LABSPEC 1.027 01/31/2018 1139   PHURINE 5.0 01/31/2018 1139   GLUCOSEU >=500 (A) 01/31/2018 1139   HGBUR NEGATIVE 01/31/2018 1139   BILIRUBINUR NEGATIVE 01/31/2018 1139   KETONESUR 80 (A) 01/31/2018 1139   PROTEINUR NEGATIVE 01/31/2018 1139   UROBILINOGEN 1.0 10/07/2014 0945   NITRITE NEGATIVE 01/31/2018 1139   LEUKOCYTESUR NEGATIVE 01/31/2018 1139   Sepsis Labs: @LABRCNTIP (procalcitonin:4,lacticidven:4) ) Recent Results (from the past 240 hour(s))  MRSA PCR Screening     Status: None   Collection Time: 01/31/18  4:51 PM  Result Value Ref Range Status   MRSA by PCR NEGATIVE NEGATIVE Final    Comment:        The GeneXpert MRSA Assay (FDA approved for NASAL specimens only), is one component of a comprehensive MRSA colonization surveillance program. It is not intended to diagnose MRSA infection nor to guide  or monitor treatment for MRSA infections. Performed at [facility], [address], [place], [place] [address]   Respiratory Panel by PCR     Status: None   Collection Time: 01/31/18  5:23 PM  Result Value Ref Range Status   Adenovirus NOT DETECTED NOT DETECTED Final   Coronavirus 229E NOT DETECTED NOT DETECTED Final   Coronavirus HKU1 NOT DETECTED NOT DETECTED Final   Coronavirus NL63 NOT DETECTED NOT DETECTED Final   Coronavirus OC43 NOT DETECTED NOT DETECTED Final   Metapneumovirus NOT DETECTED NOT DETECTED Final   Rhinovirus / Enterovirus NOT DETECTED NOT DETECTED Final   Influenza A NOT DETECTED NOT DETECTED Final   Influenza A H1 NOT DETECTED NOT DETECTED Final   Influenza A H1 2009 NOT DETECTED NOT DETECTED Final   Influenza A H3 NOT DETECTED NOT DETECTED Final   Influenza B NOT DETECTED NOT DETECTED Final   Parainfluenza Virus 1 NOT DETECTED NOT DETECTED Final   Parainfluenza Virus 2 NOT DETECTED NOT DETECTED Final   Parainfluenza Virus 3 NOT DETECTED NOT DETECTED Final   Parainfluenza Virus 4 NOT  DETECTED NOT DETECTED Final   Respiratory Syncytial Virus NOT DETECTED NOT DETECTED Final   Bordetella pertussis NOT DETECTED NOT DETECTED Final   Chlamydophila pneumoniae NOT DETECTED NOT DETECTED Final   Mycoplasma pneumoniae NOT DETECTED NOT DETECTED Final    Comment: Performed at [facility], [address] [place], [place] [address]  Culture, blood (Routine X 2) w Reflex to ID Panel     Status: None (Preliminary result)   Collection Time: 01/31/18  6:05 PM  Result Value Ref Range Status   Specimen Description   Final    BLOOD LEFT WRIST BOTTLES DRAWN AEROBIC ONLY Blood Culture adequate volume   Special Requests   Final    NONE Performed at [facility], [address] [place], [place] [address]    Culture PENDING  Incomplete   Report Status PENDING  Incomplete  Culture, blood (Routine X 2) w Reflex to ID Panel     Status: None (Preliminary result)   Collection Time: 01/31/18  6:05 PM  Result Value Ref Range Status   Specimen Description   Final    BLOOD LEFT WRIST BOTTLES DRAWN AEROBIC AND ANAEROBIC Blood Culture adequate volume   Special Requests   Final    NONE Performed at [facility], [address], [place], [place] [address]    Culture PENDING  Incomplete   Report Status PENDING  Incomplete     Scheduled Meds: . enoxaparin (LOVENOX) injection  40 mg Subcutaneous Q24H  . escitalopram  10 mg Oral Daily  . [START ON 02/02/2018] Influenza vac split quadrivalent PF  0.5 mL Intramuscular Tomorrow-1000   Continuous Infusions: . sodium chloride    . dextrose 5 % and 0.45% NaCl 125 mL/hr at 02/01/18 0622  . insulin 5.6 mL/hr at 02/01/18 0622  . insulin      Procedures/Studies: Dg Chest 2 View  Result Date: 01/31/2018 CLINICAL DATA:  Cough.  Hyperglycemia EXAM: CHEST - 2 VIEW COMPARISON:  February 11, 2016 FINDINGS: No edema or consolidation. Heart size and pulmonary vascularity are normal. No adenopathy. No bone lesions. IMPRESSION: No edema or consolidation.  Electronically Signed   By: Bretta Bang III M.D.   On: 01/31/2018 13:33   US Abdomen Limited Ruq  Result Date: 01/31/2018 CLINICAL DATA:  Hyperbilirubinemia. EXAM: ULTRASOUND ABDOMEN LIMITED RIGHT UPPER QUADRANT COMPARISON:  Abdominal ultrasound dated February 11, 2017. FINDINGS: Gallbladder:  No gallstones or wall thickening visualized. No sonographic Murphy sign noted by sonographer. Common bile duct: Diameter: 5 mm, normal. Liver: No focal lesion identified. Within normal limits in parenchymal echogenicity. Portal vein is patent on color Doppler imaging with normal direction of blood flow towards the liver. IMPRESSION: Normal right upper quadrant ultrasound. Electronically Signed   By: Obie Dredge M.D.   On: 01/31/2018 17:46    Catarina Hartshorn, DO  [facility] Pager [phone]  If 7PM-7AM, please contact night-coverage [email or web address] Password [email or web address] 02/01/2018, 8:24 AM   LOS: 1 day

## 2018-02-02 DIAGNOSIS — E1065 Type 1 diabetes mellitus with hyperglycemia: Secondary | ICD-10-CM

## 2018-02-02 DIAGNOSIS — E1010 Type 1 diabetes mellitus with ketoacidosis without coma: Principal | ICD-10-CM

## 2018-02-02 LAB — BASIC METABOLIC PANEL
Anion gap: 8 (ref 5–15)
BUN: 6 mg/dL (ref 6–20)
CHLORIDE: 111 mmol/L (ref 98–111)
CO2: 21 mmol/L — ABNORMAL LOW (ref 22–32)
CREATININE: 0.46 mg/dL (ref 0.44–1.00)
Calcium: 8 mg/dL — ABNORMAL LOW (ref 8.9–10.3)
Glucose, Bld: 101 mg/dL — ABNORMAL HIGH (ref 70–99)
POTASSIUM: 3.4 mmol/L — AB (ref 3.5–5.1)
Sodium: 140 mmol/L (ref 135–145)

## 2018-02-02 LAB — CBC
HCT: 37.6 % (ref 36.0–46.0)
HEMOGLOBIN: 12.4 g/dL (ref 12.0–15.0)
MCH: 29.5 pg (ref 26.0–34.0)
MCHC: 33 g/dL (ref 30.0–36.0)
MCV: 89.3 fL (ref 80.0–100.0)
Platelets: 261 10*3/uL (ref 150–400)
RBC: 4.21 MIL/uL (ref 3.87–5.11)
RDW: 12.1 % (ref 11.5–15.5)
WBC: 7.5 10*3/uL (ref 4.0–10.5)
nRBC: 0 % (ref 0.0–0.2)

## 2018-02-02 LAB — GLUCOSE, CAPILLARY
GLUCOSE-CAPILLARY: 93 mg/dL (ref 70–99)
GLUCOSE-CAPILLARY: 101 mg/dL — AB (ref 70–99)
Glucose-Capillary: 93 mg/dL (ref 70–99)

## 2018-02-02 LAB — PROCALCITONIN: Procalcitonin: 2.02 ng/mL

## 2018-02-02 LAB — MAGNESIUM: MAGNESIUM: 1.8 mg/dL (ref 1.7–2.4)

## 2018-02-02 MED ORDER — ALUM & MAG HYDROXIDE-SIMETH 200-200-20 MG/5ML PO SUSP
30.0000 mL | ORAL | Status: DC | PRN
  Administered 2018-02-02: 30 mL via ORAL
  Filled 2018-02-02: qty 30

## 2018-02-02 MED ORDER — INSULIN PUMP: SUBCUTANEOUS | 0 refills | Status: AC

## 2018-02-02 MED ORDER — POTASSIUM CHLORIDE CRYS ER 20 MEQ PO TBCR
20.0000 meq | EXTENDED_RELEASE_TABLET | Freq: Once | ORAL | Status: AC
  Administered 2018-02-02: 20 meq via ORAL
  Filled 2018-02-02: qty 1

## 2018-02-02 MED ORDER — MAGNESIUM SULFATE 2 GM/50ML IV SOLN
2.0000 g | Freq: Once | INTRAVENOUS | Status: AC
  Administered 2018-02-02: 2 g via INTRAVENOUS
  Filled 2018-02-02: qty 50

## 2018-02-02 NOTE — Discharge Summary (Signed)
Physician Discharge Summary  Norma Allen NFA:[id number] DOB: 1986-03-03 DOA: 01/31/2018  PCP: Benita Stabile, MD  Admit date: 01/31/2018 Discharge date: 02/02/2018  Admitted From: Home Disposition:  Home   Recommendations for Outpatient Follow-up:  1. Follow up with PCP in 1-2 weeks 2. Please obtain BMP/CBC in one week    Discharge Condition: Stable CODE STATUS: FULL Diet recommendation: Carb Modified    Brief/Interim Summary: 32 y.o.femalewith medical history ofdiabetes mellitus type 1, depression presenting with 1 to 2-day history of generalized weakness and high blood sugars. The patient has been diagnosed with diabetes mellitus type I since she was age 43. She is currently using an insulin pump with which she has been compliant. On 01/31/2018, the patient noted her blood sugars to be as high as 507. She bolused herself several times on the insulin pump, and her blood sugars never went below 470. The patient began having some nausea and vomiting with generalized weakness. As result, the patient presented for further evaluation. The patient has been rotating her sites on her insulin pump. She denies any fevers, chills, dysuria, hematuria, headache, neck pain. She has been having some upper abdominal discomfort starting last 24 hours without any hematemesis, hematochezia, melena. The patient smokes 4 to 5 cigarettes/day, but she denies any illegal drug use or alcohol. 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  Notably, the patient states that she has been having chest congestion and coughing for the better part of 1 month. She was given a Z-Pak by her primary care provider in mid-September, but her symptoms have continued. In the emergency department, the patient was afebrile hemodynamically stable and saturating 100% on room air. She was tachycardic up into the 120s. WBC is 25.5. BMP showed sodium 132 with serum creatinine 1.04. LFTs were unremarkable.  Anion gap was 17 with ketonuria. Chest x-ray was negative. The patient was started on an insulin IV drip and given 2 L fluid bolus and continued on IVF.  She improved clinically.  She was transitioned back to her insulin pump with her home settings.  She remained clinically stable and her diet was advanced which she tolerated.  Discharge Diagnoses:  DKA, type 1 -suspect may have been precipitated by recent viral infection -patient started on IV insulin with q 1 hour CBG check and q 4 hour BMPs -continue aggressive fluid resuscitation -continue q 4 hour BMP -transitioned to [place] insulin once anion gap closed in afternoon of 02/01/18 -insulin pump back on home settings once gap is improved -diet was advanced and pt tolerated -02/01/18-HbA1C--7.9  Acute Bronchitis -check influenza pcr--neg -viral respiratory panel--neg -stable on RA  Type 1 diabetes mellitus, uncontrolled with hyperglycemia -02/01/18 A1C = 7.9 -follow up with PCP for further adjustment of insulin pump  Leukocytosis -Likely due to stress demargination -pt remained afebrile and hemodynamically stable throughout the hospitalization -Blood cultures x2 sets--neg to date -Check procalcitonin--7.36>>>4.38 -CXR--personally reviewed--no infiltrates or edema -UA neg for pyuria -personally reviewed EKG--sinus, nonspecific T wave changes -WBC 7.5 on day of d/c  Hyperbilirubinemia -Right upper quadrant ultrasound--neg -Fractionate bilirubin--mostly indirect -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  I have counseled the patient regarding the negative impacts of continued tobacco use including but not limited to lung cancer, COPD, and cardiovascular disease.  I have discussed alternatives to tobacco and modalities that may help facilitate tobacco cessation including but not limited to biofeedback, hypnosis, and medications.  Total time spent with tobacco counseling was 4  minutes.   Discharge Instructions   Allergies as of 02/02/2018   No Known Allergies     Medication List    STOP taking these medications   NOVOLOG 100 UNIT/ML injection Generic drug:  insulin aspart     TAKE these medications   cetirizine 10 MG tablet Commonly known as:  ZYRTEC Take 1 tablet by mouth daily.   escitalopram 10 MG tablet Commonly known as:  LEXAPRO Take 10 mg by mouth daily.   fluticasone 50 MCG/ACT nasal spray Commonly known as:  FLONASE Place 2 sprays into both nostrils 2 (two) times daily.   insulin pump Soln Continue home dose Lane insulin with basal rate totalling 36 units/24 hours       No Known Allergies  Consultations:  none   Procedures/Studies: Dg Chest 2 View  Result Date: 01/31/2018 CLINICAL DATA:  Cough.  Hyperglycemia EXAM: CHEST - 2 VIEW COMPARISON:  February 11, 2016 FINDINGS: No edema or consolidation. Heart size and pulmonary vascularity are normal. No adenopathy. No bone lesions. IMPRESSION: No edema or consolidation. Electronically Signed   By: Bretta Bang III M.D.   On: 01/31/2018 13:33   US Abdomen Limited Ruq  Result Date: 01/31/2018 CLINICAL DATA:  Hyperbilirubinemia. EXAM: ULTRASOUND ABDOMEN LIMITED RIGHT UPPER QUADRANT COMPARISON:  Abdominal ultrasound dated February 11, 2017. FINDINGS: Gallbladder: No gallstones or wall thickening visualized. No sonographic Murphy sign noted by sonographer. Common bile duct: Diameter: 5 mm, normal. Liver: No focal lesion identified. Within normal limits in parenchymal echogenicity. Portal vein is patent on color Doppler imaging with normal direction of blood flow towards the liver. IMPRESSION: Normal right upper quadrant ultrasound. Electronically Signed   By: Obie Dredge M.D.   On: 01/31/2018 17:46        Discharge Exam: Vitals:   02/02/18 0500 02/02/18 0746  BP: (!) 137/98   Pulse: 91 98  Resp: 17 19  Temp:  98.9 F (37.2 C)  SpO2: 98% 97%   Vitals:   02/02/18 0300  02/02/18 0400 02/02/18 0500 02/02/18 0746  BP:  125/78 (!) 137/98   Pulse: 93 (!) 107 91 98  Resp: 19 18 17 19   Temp:  98.1 F (36.7 C)  98.9 F (37.2 C)  TempSrc:  Oral  Oral  SpO2: 97% 99% 98% 97%  Weight:  76.1 kg    Height:        General: Pt is alert, awake, not in acute distress Cardiovascular: RRR, S1/S2 +, no rubs, no gallops Respiratory: CTA bilaterally, no wheezing, no rhonchi Abdominal: Soft, NT, ND, bowel sounds + Extremities: no edema, no cyanosis   The results of significant diagnostics from this hospitalization (including imaging, microbiology, ancillary and laboratory) are listed below for reference.    Significant Diagnostic Studies: Dg Chest 2 View  Result Date: 01/31/2018 CLINICAL DATA:  Cough.  Hyperglycemia EXAM: CHEST - 2 VIEW COMPARISON:  February 11, 2016 FINDINGS: No edema or consolidation. Heart size and pulmonary vascularity are normal. No adenopathy. No bone lesions. IMPRESSION: No edema or consolidation. Electronically Signed   By: Bretta Bang III M.D.   On: 01/31/2018 13:33   US Abdomen Limited Ruq  Result Date: 01/31/2018 CLINICAL DATA:  Hyperbilirubinemia. EXAM: ULTRASOUND ABDOMEN LIMITED RIGHT UPPER QUADRANT COMPARISON:  Abdominal ultrasound dated February 11, 2017. FINDINGS: Gallbladder: No gallstones or wall thickening visualized. No sonographic Murphy sign noted by sonographer. Common bile duct: Diameter: 5 mm, normal. Liver: No focal lesion identified. Within normal limits in parenchymal echogenicity. Portal vein is patent  on color Doppler imaging with normal direction of blood flow towards the liver. IMPRESSION: Normal right upper quadrant ultrasound. Electronically Signed   By: Obie Dredge M.D.   On: 01/31/2018 17:46     Microbiology: Recent Results (from the past 240 hour(s))  MRSA PCR Screening     Status: None   Collection Time: 01/31/18  4:51 PM  Result Value Ref Range Status   MRSA by PCR NEGATIVE NEGATIVE Final    Comment:         The GeneXpert MRSA Assay (FDA approved for NASAL specimens only), is one component of a comprehensive MRSA colonization surveillance program. It is not intended to diagnose MRSA infection nor to guide or monitor treatment for MRSA infections. Performed at [facility], [address], [place], [place] [address]   Respiratory Panel by PCR     Status: None   Collection Time: 01/31/18  5:23 PM  Result Value Ref Range Status   Adenovirus NOT DETECTED NOT DETECTED Final   Coronavirus 229E NOT DETECTED NOT DETECTED Final   Coronavirus HKU1 NOT DETECTED NOT DETECTED Final   Coronavirus NL63 NOT DETECTED NOT DETECTED Final   Coronavirus OC43 NOT DETECTED NOT DETECTED Final   Metapneumovirus NOT DETECTED NOT DETECTED Final   Rhinovirus / Enterovirus NOT DETECTED NOT DETECTED Final   Influenza A NOT DETECTED NOT DETECTED Final   Influenza A H1 NOT DETECTED NOT DETECTED Final   Influenza A H1 2009 NOT DETECTED NOT DETECTED Final   Influenza A H3 NOT DETECTED NOT DETECTED Final   Influenza B NOT DETECTED NOT DETECTED Final   Parainfluenza Virus 1 NOT DETECTED NOT DETECTED Final   Parainfluenza Virus 2 NOT DETECTED NOT DETECTED Final   Parainfluenza Virus 3 NOT DETECTED NOT DETECTED Final   Parainfluenza Virus 4 NOT DETECTED NOT DETECTED Final   Respiratory Syncytial Virus NOT DETECTED NOT DETECTED Final   Bordetella pertussis NOT DETECTED NOT DETECTED Final   Chlamydophila pneumoniae NOT DETECTED NOT DETECTED Final   Mycoplasma pneumoniae NOT DETECTED NOT DETECTED Final    Comment: Performed at [facility], [address], [place], [place] [address]  Culture, blood (Routine X 2) w Reflex to ID Panel     Status: None (Preliminary result)   Collection Time: 01/31/18  6:05 PM  Result Value Ref Range Status   Specimen Description   Final    BLOOD LEFT WRIST BOTTLES DRAWN AEROBIC ONLY Blood Culture adequate volume   Special Requests NONE  Final   Culture   Final    NO GROWTH 2  DAYS Performed at [facility], [address] [place], [place] [address]    Report Status PENDING  Incomplete  Culture, blood (Routine X 2) w Reflex to ID Panel     Status: None (Preliminary result)   Collection Time: 01/31/18  6:05 PM  Result Value Ref Range Status   Specimen Description   Final    BLOOD LEFT WRIST BOTTLES DRAWN AEROBIC AND ANAEROBIC Blood Culture adequate volume   Special Requests NONE  Final   Culture   Final    NO GROWTH 2 DAYS Performed at [facility], [address] [place], [place] [address]    Report Status PENDING  Incomplete     Labs: Basic Metabolic Panel: Recent Labs  Lab 01/31/18 1805 02/01/18 0052 02/01/18 0445 02/01/18 0927 02/02/18 0430  NA 135 137 135 137 140  K 4.3 3.8 4.1 3.7 3.4*  CL 110 111 109 110 111  CO2 14* 19* 14* 20* 21*  GLUCOSE 254* 94 289* 141* 101*  BUN 15 9 10 9 6   CREATININE 0.74 0.64 0.71 0.63 0.46  CALCIUM 8.0* 8.1* 8.1* 8.2* 8.0*  MG  --   --   --   --  1.8   Liver Function Tests: Recent Labs  Lab 01/31/18 1209 01/31/18 1805 02/01/18 0445  AST 21  --  14*  ALT 23  --  13  ALKPHOS 92  --  61  BILITOT 1.7* 1.2 1.5*  PROT 8.9*  --  6.1*  ALBUMIN 4.7  --  3.2*   Recent Labs  Lab 01/31/18 1805  LIPASE 28   No results for input(s): AMMONIA in the last 168 hours. CBC: Recent Labs  Lab 01/31/18 1209 02/01/18 0445 02/02/18 0430  WBC 25.5* 13.0* 7.5  NEUTROABS 22.7*  --   --   HGB 15.8* 13.0 12.4  HCT 49.3* 39.3 37.6  MCV 91.8 91.6 89.3  PLT 304 286 261   Cardiac Enzymes: No results for input(s): CKTOTAL, CKMB, CKMBINDEX, TROPONINI in the last 168 hours. BNP: Invalid input(s): POCBNP CBG: Recent Labs  Lab 02/01/18 2139 02/01/18 2333 02/02/18 0320 02/02/18 0435 02/02/18 0745  GLUCAP 63* 110* 93 101* 93    Time coordinating discharge:  36 minutes  Signed:  Catarina Hartshorn, DO [facility] Pager: [phone] 02/02/2018, 8:43 AM

## 2018-02-02 NOTE — Progress Notes (Signed)
Patient given discharge instructions. Verbalizes understanding. IV removed. Flu vaccine given. Discharged to private residence. Taken down to front by wheelchair.

## 2018-02-02 NOTE — Progress Notes (Signed)
Discrepancies noticed with patient's insulin pump and hospital glucometer. Last blood sugar at 0200 was 93 by hospital glucometer; pt states her pump read 138. Pt then stated that insulin pump was recalibrating. Pt also stated that she had been administering 2 unit doses periodically to prevent her blood sugar from peaking. Asked patient to recheck sugar and notify if any insulin was administered.

## 2018-02-02 NOTE — Progress Notes (Signed)
Had patient document the time she checks blood sugars, the reading and if she administered any insulin via pump:  2115- 80 2145- 63 2205- 92 administered 2 units 2350- 93 administered 2 units.   Pt called at 0400 because insulin was reading 63. Checked with our glucometer: 103. Pt did not administer any insulin and did not report any symptoms of possible hypoglycemia.

## 2018-02-05 LAB — CULTURE, BLOOD (ROUTINE X 2)
Culture: NO GROWTH
Culture: NO GROWTH

## 2018-05-21 ENCOUNTER — Encounter: Payer: Self-pay | Admitting: Obstetrics and Gynecology

## 2018-05-21 DIAGNOSIS — O021 Missed abortion: Secondary | ICD-10-CM

## 2018-05-21 HISTORY — DX: Missed abortion: O02.1

## 2018-05-21 NOTE — H&P (Signed)
Norma Allen is a 33 y.o. female G2P1001 at 11+ with missed ab.  Pt came for inititiation of OB care, on [place] no fht's.  Embryo measuring 10 week size.  (EDC by LMP was 12/09/18).  Pt with Diabetes mellitus type 1, aso smoker and h/o PPD.  D/W pt r/b/a of surgery (D&E) wishes to proceed,  OB History    Gravida  2   Para  1   Term  1   Preterm      AB   1   Living  1     SAB      TAB      Ectopic      Multiple  0   Live Births  1         G1 37wk SVD, 8#10 G2 present  No abn pap, last HR HPV neg No STD  Past Medical History:  Diagnosis Date  . Depression    meds 3 years ago  . Diabetes mellitus type I (HCC)   . DKA (diabetic ketoacidoses) (HCC)   . Hx of varicella   . Missed abortion 05/21/2018  . SVD (spontaneous vaginal delivery) 10/08/2014   Past Surgical History:  Procedure Laterality Date  . WISDOM TOOTH EXTRACTION     Family History: family history includes Cancer in her mother; Diabetes in her father; Hypertension in her father and mother. Social History:  reports that she quit smoking about 4 years ago. Her smoking use included cigarettes. She has a 2.00 pack-year smoking history. She has never used smokeless tobacco. 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   HENT: Negative.   Eyes: Negative.   Respiratory: Negative.   Cardiovascular: Negative.   Gastrointestinal: Negative.   Genitourinary: Negative.   Musculoskeletal: Negative.   Skin: Negative.   Neurological: Negative.   Psychiatric/Behavioral: Negative.    Maternal Medical History:  Prenatal Complications - Diabetes: type 1. Diabetes is managed by insulin pump.        unknown if currently breastfeeding. Maternal Exam:  Abdomen: Patient reports no abdominal tenderness. Introitus: Normal vulva. Normal vagina.    Physical Exam  Constitutional: She is oriented  to person, place, and time. She appears well-developed and well-nourished.  HENT:  Head: Normocephalic and atraumatic.  Cardiovascular: Normal rate and regular rhythm.  Respiratory: Effort normal and breath sounds normal. No respiratory distress. She has no wheezes.  GI: Soft. Bowel sounds are normal. She exhibits no distension. There is no abdominal tenderness.  Genitourinary:    Vulva normal.   Musculoskeletal: Normal range of motion.  Neurological: She is alert and oriented to person, place, and time.  Skin: Skin is warm and dry.  Psychiatric: She has a normal mood and affect. Her behavior is normal.    Prenatal labs: ABO, Rh:  O+ [place] CRL = 10, no FHT  Assessment/Plan: 33yo G2P1011 at 10-11wk with missed ab for suction D&E D/w pt r/b/a of surgery Pt is type 1 - DM  Mattthew Ziomek Bovard-Stuckert 05/21/2018, 7:39 AM

## 2018-05-22 ENCOUNTER — Ambulatory Visit (HOSPITAL_COMMUNITY)
Admission: RE | Admit: 2018-05-22 | Discharge: 2018-05-22 | Disposition: A | Discharge status: Home / Self Care | Payer: BC Managed Care – PPO | Attending: Obstetrics and Gynecology | Admitting: Obstetrics and Gynecology

## 2018-05-22 ENCOUNTER — Other Ambulatory Visit: Payer: Self-pay

## 2018-05-22 ENCOUNTER — Encounter (HOSPITAL_COMMUNITY)
Admission: RE | Disposition: A | Discharge status: Home / Self Care | Payer: Self-pay | Source: Home / Self Care | Attending: Obstetrics and Gynecology

## 2018-05-22 ENCOUNTER — Encounter (HOSPITAL_COMMUNITY): Payer: Self-pay

## 2018-05-22 ENCOUNTER — Ambulatory Visit (HOSPITAL_COMMUNITY): Payer: BC Managed Care – PPO | Admitting: Certified Registered Nurse Anesthetist

## 2018-05-22 DIAGNOSIS — Z3A11 11 weeks gestation of pregnancy: Secondary | ICD-10-CM | POA: Insufficient documentation

## 2018-05-22 DIAGNOSIS — Z9641 Presence of insulin pump (external) (internal): Secondary | ICD-10-CM | POA: Diagnosis not present

## 2018-05-22 DIAGNOSIS — Z794 Long term (current) use of insulin: Secondary | ICD-10-CM | POA: Insufficient documentation

## 2018-05-22 DIAGNOSIS — E109 Type 1 diabetes mellitus without complications: Secondary | ICD-10-CM | POA: Insufficient documentation

## 2018-05-22 DIAGNOSIS — O021 Missed abortion: Secondary | ICD-10-CM | POA: Diagnosis present

## 2018-05-22 DIAGNOSIS — Z87891 Personal history of nicotine dependence: Secondary | ICD-10-CM | POA: Diagnosis not present

## 2018-05-22 DIAGNOSIS — O24911 Unspecified diabetes mellitus in pregnancy, first trimester: Secondary | ICD-10-CM | POA: Diagnosis not present

## 2018-05-22 HISTORY — DX: Missed abortion: O02.1

## 2018-05-22 HISTORY — PX: DILATION AND EVACUATION: SHX1459

## 2018-05-22 LAB — CBC
HCT: 41.3 % (ref 36.0–46.0)
Hemoglobin: 14.3 g/dL (ref 12.0–15.0)
MCH: 30.4 pg (ref 26.0–34.0)
MCHC: 34.6 g/dL (ref 30.0–36.0)
MCV: 87.9 fL (ref 80.0–100.0)
NRBC: 0 % (ref 0.0–0.2)
Platelets: 286 10*3/uL (ref 150–400)
RBC: 4.7 MIL/uL (ref 3.87–5.11)
RDW: 12 % (ref 11.5–15.5)
WBC: 10 10*3/uL (ref 4.0–10.5)

## 2018-05-22 LAB — TYPE AND SCREEN
ABO/RH(D): O POS
Antibody Screen: NEGATIVE

## 2018-05-22 SURGERY — DILATION AND EVACUATION, UTERUS
Anesthesia: Monitor Anesthesia Care | Site: Vagina

## 2018-05-22 MED ORDER — SODIUM CHLORIDE 0.9 % IV SOLN: 100.0000 mg | Freq: Two times a day (BID) | INTRAVENOUS | Status: DC

## 2018-05-22 MED ORDER — LIDOCAINE HCL 2 % IJ SOLN
INTRAMUSCULAR | Status: AC
  Filled 2018-05-22: qty 20

## 2018-05-22 MED ORDER — LACTATED RINGERS IV SOLN
INTRAVENOUS | Status: DC
  Administered 2018-05-22 (×2): via INTRAVENOUS

## 2018-05-22 MED ORDER — LACTATED RINGERS IV SOLN
INTRAVENOUS | Status: DC
  Administered 2018-05-22: 13:00:00 via INTRAVENOUS

## 2018-05-22 MED ORDER — MIDAZOLAM HCL 2 MG/2ML IJ SOLN
INTRAMUSCULAR | Status: DC | PRN
  Administered 2018-05-22: 2 mg via INTRAVENOUS

## 2018-05-22 MED ORDER — FENTANYL CITRATE (PF) 100 MCG/2ML IJ SOLN
INTRAMUSCULAR | Status: DC | PRN
  Administered 2018-05-22 (×2): 50 ug via INTRAVENOUS

## 2018-05-22 MED ORDER — KETOROLAC TROMETHAMINE 30 MG/ML IJ SOLN
INTRAMUSCULAR | Status: AC
  Filled 2018-05-22: qty 1

## 2018-05-22 MED ORDER — FENTANYL CITRATE (PF) 100 MCG/2ML IJ SOLN: 25.0000 ug | INTRAMUSCULAR | Status: DC | PRN

## 2018-05-22 MED ORDER — LIDOCAINE HCL (PF) 1 % IJ SOLN
INTRAMUSCULAR | Status: AC
  Filled 2018-05-22: qty 5

## 2018-05-22 MED ORDER — LIDOCAINE HCL (CARDIAC) PF 100 MG/5ML IV SOSY
PREFILLED_SYRINGE | INTRAVENOUS | Status: DC | PRN
  Administered 2018-05-22: 50 mg via INTRAVENOUS

## 2018-05-22 MED ORDER — PROPOFOL 10 MG/ML IV BOLUS
INTRAVENOUS | Status: AC
  Filled 2018-05-22: qty 40

## 2018-05-22 MED ORDER — ONDANSETRON HCL 4 MG/2ML IJ SOLN
INTRAMUSCULAR | Status: DC | PRN
  Administered 2018-05-22: 4 mg via INTRAVENOUS

## 2018-05-22 MED ORDER — SCOPOLAMINE 1 MG/3DAYS TD PT72
1.0000 | MEDICATED_PATCH | Freq: Once | TRANSDERMAL | Status: DC
  Administered 2018-05-22: 1.5 mg via TRANSDERMAL

## 2018-05-22 MED ORDER — LIDOCAINE HCL 2 % IJ SOLN
INTRAMUSCULAR | Status: DC | PRN
  Administered 2018-05-22: 20 mL

## 2018-05-22 MED ORDER — SODIUM CHLORIDE 0.9 % IV SOLN
100.0000 mg | Freq: Once | INTRAVENOUS | Status: AC
  Administered 2018-05-22: 100 mg via INTRAVENOUS
  Filled 2018-05-22: qty 100

## 2018-05-22 MED ORDER — MIDAZOLAM HCL 2 MG/2ML IJ SOLN
INTRAMUSCULAR | Status: AC
  Filled 2018-05-22: qty 2

## 2018-05-22 MED ORDER — DOXYCYCLINE HYCLATE 100 MG IV SOLR: 200.0000 mg | Freq: Once | INTRAVENOUS | Status: DC

## 2018-05-22 MED ORDER — PROMETHAZINE HCL 25 MG/ML IJ SOLN: 6.2500 mg | INTRAMUSCULAR | Status: DC | PRN

## 2018-05-22 MED ORDER — FENTANYL CITRATE (PF) 100 MCG/2ML IJ SOLN
INTRAMUSCULAR | Status: AC
  Filled 2018-05-22: qty 2

## 2018-05-22 MED ORDER — SCOPOLAMINE 1 MG/3DAYS TD PT72
MEDICATED_PATCH | TRANSDERMAL | Status: AC
  Administered 2018-05-22: 1.5 mg via TRANSDERMAL
  Filled 2018-05-22: qty 1

## 2018-05-22 MED ORDER — KETOROLAC TROMETHAMINE 30 MG/ML IJ SOLN
INTRAMUSCULAR | Status: DC | PRN
  Administered 2018-05-22: 30 mg via INTRAVENOUS

## 2018-05-22 MED ORDER — ACETAMINOPHEN 500 MG PO TABS
ORAL_TABLET | ORAL | Status: AC
  Administered 2018-05-22: 1000 mg
  Filled 2018-05-22: qty 2

## 2018-05-22 MED ORDER — ONDANSETRON HCL 4 MG/2ML IJ SOLN
INTRAMUSCULAR | Status: AC
  Filled 2018-05-22: qty 2

## 2018-05-22 MED ORDER — PROPOFOL 500 MG/50ML IV EMUL
INTRAVENOUS | Status: DC | PRN
  Administered 2018-05-22 (×4): 20 mg via INTRAVENOUS
  Administered 2018-05-22: 30 mg via INTRAVENOUS
  Administered 2018-05-22: 50 mg via INTRAVENOUS
  Administered 2018-05-22: 20 mg via INTRAVENOUS

## 2018-05-22 MED ORDER — IBUPROFEN 800 MG PO TABS: 800.0000 mg | ORAL_TABLET | Freq: Three times a day (TID) | ORAL | 1 refills | Status: DC | PRN

## 2018-05-22 MED ORDER — DOXYCYCLINE HYCLATE 100 MG IV SOLR
200.0000 mg | Freq: Once | INTRAVENOUS | Status: AC
  Administered 2018-05-22: 200 mg via INTRAVENOUS
  Filled 2018-05-22: qty 200

## 2018-05-22 SURGICAL SUPPLY — 19 items
CATH ROBINSON RED A/P 16FR (CATHETERS) ×3 IMPLANT
DECANTER SPIKE VIAL GLASS SM (MISCELLANEOUS) ×3 IMPLANT
GLOVE BIO SURGEON STRL SZ 6.5 (GLOVE) ×2 IMPLANT
GLOVE BIO SURGEONS STRL SZ 6.5 (GLOVE) ×1
GLOVE BIOGEL PI IND STRL 7.0 (GLOVE) ×1 IMPLANT
GLOVE BIOGEL PI INDICATOR 7.0 (GLOVE) ×2
GOWN STRL REUS W/TWL LRG LVL3 (GOWN DISPOSABLE) ×6 IMPLANT
HIBICLENS CHG 4% 4OZ BTL (MISCELLANEOUS) ×3 IMPLANT
KIT BERKELEY 1ST TRIMESTER 3/8 (MISCELLANEOUS) ×3 IMPLANT
NS IRRIG 1000ML POUR BTL (IV SOLUTION) ×3 IMPLANT
PACK VAGINAL MINOR WOMEN LF (CUSTOM PROCEDURE TRAY) ×3 IMPLANT
PAD OB MATERNITY 4.3X12.25 (PERSONAL CARE ITEMS) ×3 IMPLANT
PAD PREP 24X48 CUFFED NSTRL (MISCELLANEOUS) ×3 IMPLANT
SET BERKELEY SUCTION TUBING (SUCTIONS) ×3 IMPLANT
TOWEL OR 17X24 6PK STRL BLUE (TOWEL DISPOSABLE) ×6 IMPLANT
VACURETTE 10 RIGID CVD (CANNULA) ×2 IMPLANT
VACURETTE 7MM CVD STRL WRAP (CANNULA) IMPLANT
VACURETTE 8 RIGID CVD (CANNULA) IMPLANT
VACURETTE 9 RIGID CVD (CANNULA) IMPLANT

## 2018-05-22 NOTE — Anesthesia Postprocedure Evaluation (Signed)
Anesthesia Post Note  Patient: Norma Allen  Procedure(s) Performed: DILATATION AND EVACUATION (N/A Vagina )     Patient location during evaluation: PACU Anesthesia Type: MAC Level of consciousness: awake and alert, awake and oriented Pain management: pain level controlled Vital Signs Assessment: post-procedure vital signs reviewed and stable Respiratory status: spontaneous breathing, nonlabored ventilation, respiratory function stable and patient connected to nasal cannula oxygen Cardiovascular status: stable and blood pressure returned to baseline Postop Assessment: no apparent nausea or vomiting Anesthetic complications: no    Last Vitals:  Vitals:   05/22/18 1518 05/22/18 1543  BP:  133/76  Pulse: 78 84  Resp: 19 18  Temp: 36.8 C   SpO2: 100% 100%    Last Pain:  Vitals:   05/22/18 1543  TempSrc:   PainSc: 0-No pain   Pain Goal: Patients Stated Pain Goal: 3 (05/22/18 1247)                 Catalina Gravel

## 2018-05-22 NOTE — Interval H&P Note (Signed)
History and Physical Interval Note:  05/22/2018 1:30 PM  Norma Allen  has presented today for surgery, with the diagnosis of MAB  The various methods of treatment have been discussed with the patient and family. After consideration of risks, benefits and other options for treatment, the patient has consented to  Procedure(s): DILATATION AND EVACUATION (N/A) as a surgical intervention .  The patient's history has been reviewed, patient examined, no change in status, stable for surgery.  I have reviewed the patient's chart and labs.  Questions were answered to the patient's satisfaction.     Meribeth Vitug Bovard-Stuckert

## 2018-05-22 NOTE — OR Nursing (Signed)
Pt blood sugar upon arrival checked on pt insulin pump was 180

## 2018-05-22 NOTE — Discharge Instructions (Signed)

## 2018-05-22 NOTE — Op Note (Signed)
NAME: LINELL, MELDRUM MEDICAL RECORD [id number] ACCOUNT [id number] DATE OF BIRTH:26-Aug-1985 FACILITY: [facility] LOCATION: [facility] PHYSICIAN:Tyerra Loretto Sandford Craze, MD  OPERATIVE REPORT  DATE OF PROCEDURE:  05/22/2018  PREOPERATIVE DIAGNOSIS:  Missed abortion.  POSTOPERATIVE DIAGNOSIS:  Missed abortion.  PROCEDURE:  Suction D and C.  SURGEON:  Janyth Contes, MD  ANESTHESIA:  MAC.  IV FLUIDS:  Per anesthesia.  URINE OUTPUT:  Per anesthesia.  ESTIMATED BLOOD LOSS:  Approximately 25 mL.  COMPLICATIONS:  None.  PATHOLOGY:  Products of conception, both to pathology and to [place].  DESCRIPTION OF PROCEDURE:  After informed consent was reviewed with the patient including risks, benefits and alternatives of the surgical procedure, she was transported to the operating room and placed on the table in supine position.  MAC anesthesia  was induced and found to be adequate.  She had been previously placed in the [place].  She was then prepped and draped in the normal sterile fashion.  An appropriate timeout was performed.  Using an open-sided speculum, her cervix was easily  visualized.  A paracervical block was placed with 20 mL of 2% lidocaine.  The cervix was dilated to accommodate a 10-French curette.  Hegar dilators up to 31 were used.  The products of conception were removed using the suction curettage and several  passes.  Bleeding was within normal limits.  The instruments were removed from the vagina.  The patient was returned to supine position.  She was awakened in stable condition.  LN/NUANCE  D:05/22/2018 T:05/22/2018 JOB:[id number]

## 2018-05-22 NOTE — Anesthesia Preprocedure Evaluation (Addendum)
Anesthesia Evaluation  Patient identified by MRN, date of birth, ID band Patient awake    Reviewed: Allergy & Precautions, NPO status , Patient's Chart, lab work & pertinent test results  Airway Mallampati: II  TM Distance: >3 FB Neck ROM: Full    Dental  (+) Teeth Intact, Dental Advisory Given   Pulmonary former smoker,    Pulmonary exam normal breath sounds clear to auscultation       Cardiovascular negative cardio ROS Normal cardiovascular exam Rhythm:Regular Rate:Normal     Neuro/Psych PSYCHIATRIC DISORDERS Depression negative neurological ROS     GI/Hepatic negative GI ROS, Neg liver ROS,   Endo/Other  diabetes, Type 1, Insulin Dependent  Renal/GU negative Renal ROS     Musculoskeletal negative musculoskeletal ROS (+)   Abdominal   Peds  Hematology negative hematology ROS (+)   Anesthesia Other Findings Day of surgery medications reviewed with the patient.  Reproductive/Obstetrics (+) Pregnancy (MIssed abortion)                             Anesthesia Physical Anesthesia Plan  ASA: II  Anesthesia Plan: MAC   Post-op Pain Management:    Induction: Intravenous  PONV Risk Score and Plan: 2 and Midazolam, Ondansetron and Propofol infusion  Airway Management Planned: Natural Airway and Nasal Cannula  Additional Equipment:   Intra-op Plan:   Post-operative Plan:   Informed Consent: I have reviewed the patients History and Physical, chart, labs and discussed the procedure including the risks, benefits and alternatives for the proposed anesthesia with the patient or authorized representative who has indicated his/her understanding and acceptance.     Dental advisory given  Plan Discussed with: CRNA  Anesthesia Plan Comments:        Anesthesia Quick Evaluation

## 2018-05-22 NOTE — Brief Op Note (Signed)
05/22/2018  2:31 PM  PATIENT:  Norma Allen  33 y.o. female  PRE-OPERATIVE DIAGNOSIS:  MAB  POST-OPERATIVE DIAGNOSIS:  MAB  PROCEDURE:  Procedure(s): DILATATION AND EVACUATION (N/A)  SURGEON:  Surgeon(s) and Role:    * Bovard-Stuckert, Tala Eber, MD - Primary  ANESTHESIA:   MAC  EBL:  25 mL IVF and uop per anesthesia  BLOOD ADMINISTERED:none  DRAINS: none   LOCAL MEDICATIONS USED:  LIDOCAINE   SPECIMEN:  Source of Specimen:  POC  DISPOSITION OF SPECIMEN:  PATHOLOGY  And [place]  COUNTS:  YES  TOURNIQUET:  * No tourniquets in log *  DICTATION: .Other Dictation: Dictation Number [id number]  PLAN OF CARE: Discharge to home after PACU  PATIENT DISPOSITION:  PACU - hemodynamically stable.   Delay start of Pharmacological VTE agent (>24hrs) due to surgical blood loss or risk of bleeding: not applicable

## 2018-05-22 NOTE — Transfer of Care (Signed)
Immediate Anesthesia Transfer of Care Note  Patient: Norma Allen  Procedure(s) Performed: DILATATION AND EVACUATION (N/A Vagina )  Patient Location: PACU  Anesthesia Type:MAC  Level of Consciousness: awake, alert  and oriented  Airway & Oxygen Therapy: Patient Spontanous Breathing and Patient connected to nasal cannula oxygen  Post-op Assessment: Report given to RN and Post -op Vital signs reviewed and stable  Post vital signs: Reviewed and stable  Last Vitals:  Vitals Value Taken Time  BP 126/86 05/22/2018  2:21 PM  Temp    Pulse 98 05/22/2018  2:24 PM  Resp 15 05/22/2018  2:24 PM  SpO2 99 % 05/22/2018  2:24 PM  Vitals shown include unvalidated device data.  Last Pain:  Vitals:   05/22/18 1247  TempSrc: Oral  PainSc: 3       Patients Stated Pain Goal: 3 (31/59/45 8592)  Complications: No apparent anesthesia complications

## 2018-05-23 ENCOUNTER — Encounter (HOSPITAL_COMMUNITY): Payer: Self-pay | Admitting: Obstetrics and Gynecology

## 2019-03-11 ENCOUNTER — Other Ambulatory Visit: Payer: Self-pay

## 2019-03-11 DIAGNOSIS — Z20822 Contact with and (suspected) exposure to covid-19: Secondary | ICD-10-CM

## 2019-03-13 ENCOUNTER — Telehealth: Payer: Self-pay

## 2019-03-13 LAB — NOVEL CORONAVIRUS, NAA: SARS-CoV-2, NAA: NOT DETECTED

## 2019-03-13 LAB — SPECIMEN STATUS REPORT

## 2019-03-13 NOTE — Telephone Encounter (Signed)
Patient given negative result and verbalized understanding  

## 2019-07-31 ENCOUNTER — Encounter: Payer: Self-pay | Admitting: Internal Medicine

## 2019-08-30 IMAGING — US US ABDOMEN LIMITED
1 series · 14 of 25 positions shown · non-contrast
Comparison: Abdominal ultrasound dated February 11, 2017.

CLINICAL DATA: Hyperbilirubinemia.

EXAM:
ULTRASOUND ABDOMEN LIMITED RIGHT UPPER QUADRANT

[Series 1: us abdomen limited · 14 of 43 slices shown]
[im 1/43]
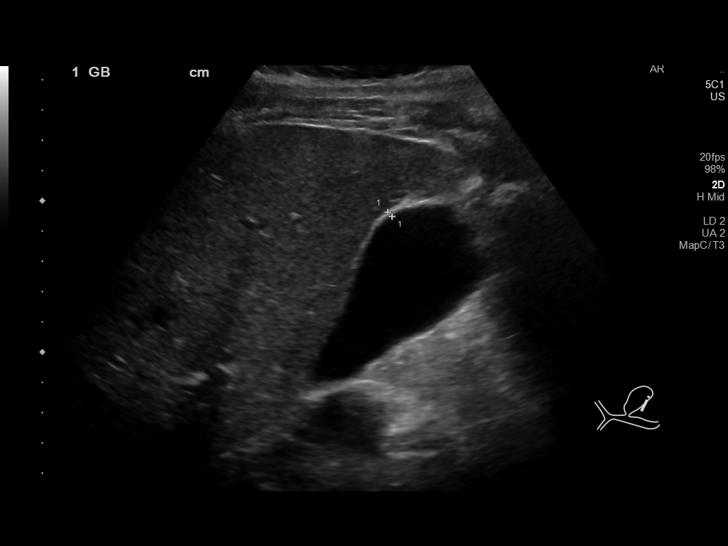
[im 4/43]
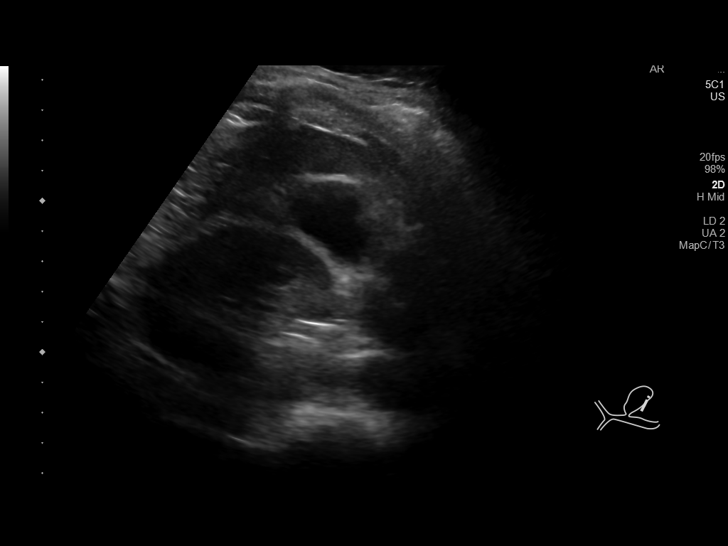
[im 8/43]
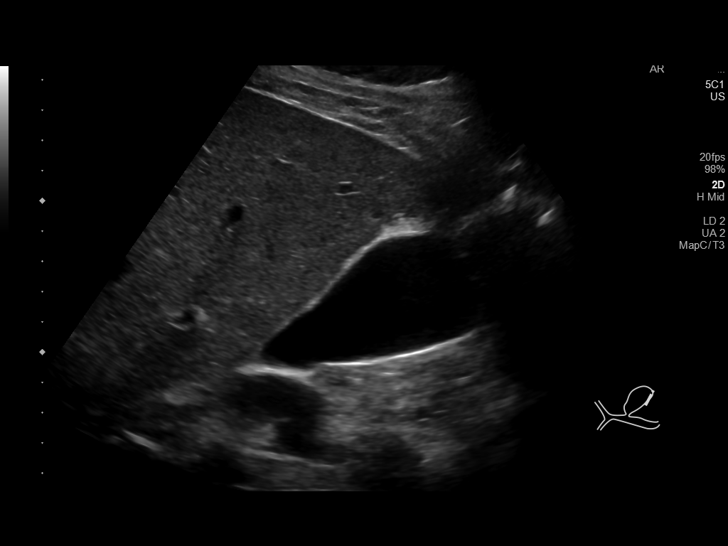
[im 11/43]
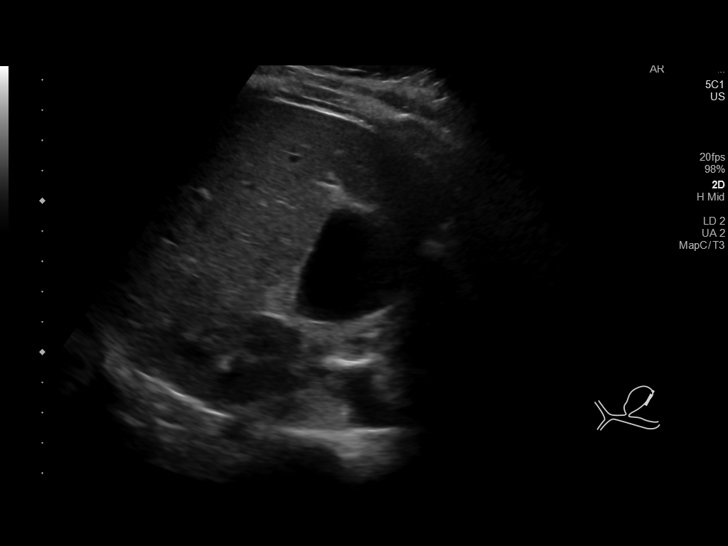
[im 15/43]
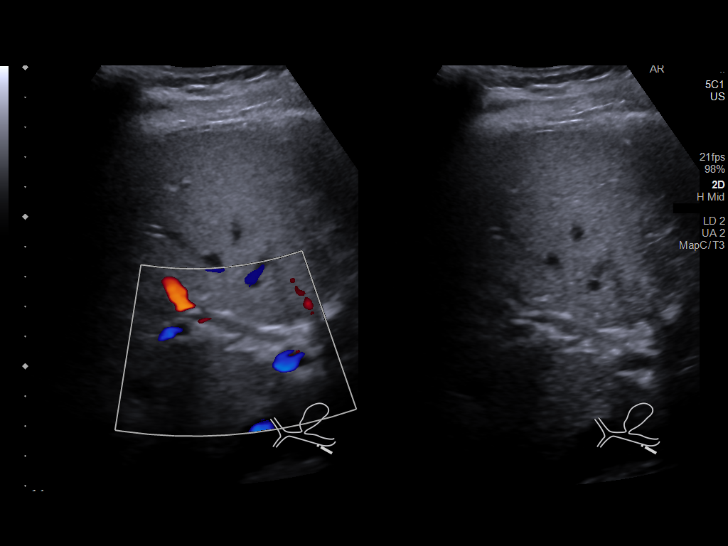
[im 16/43]
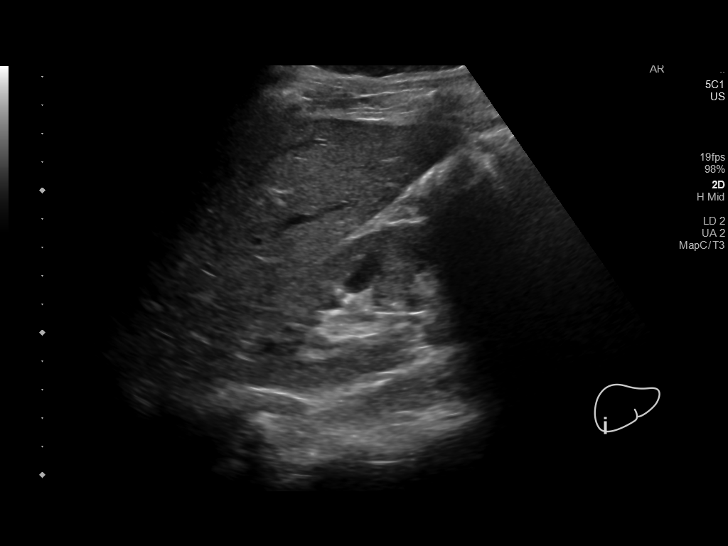
[im 20/43]
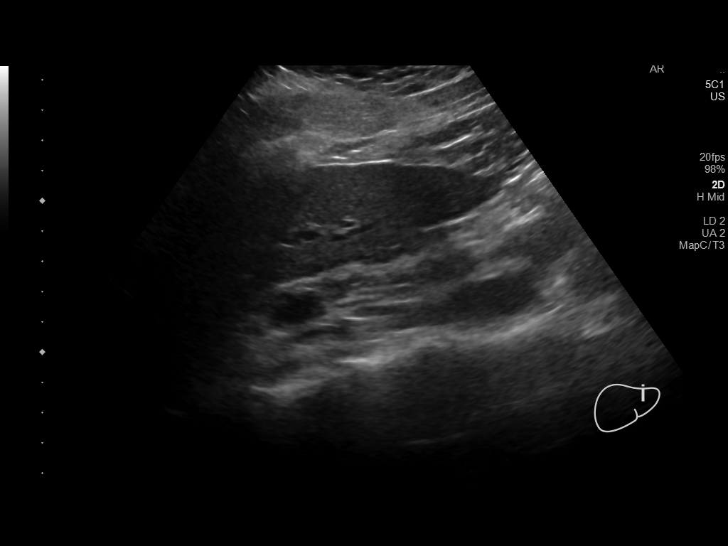
[im 23/43]
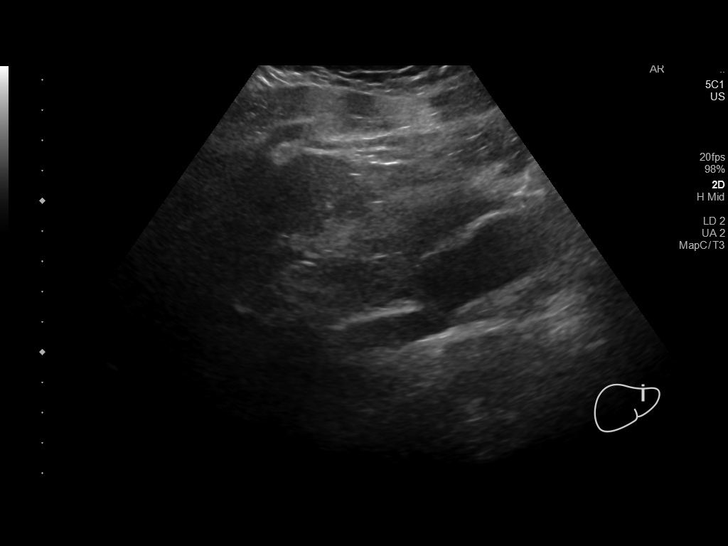
[im 27/43]
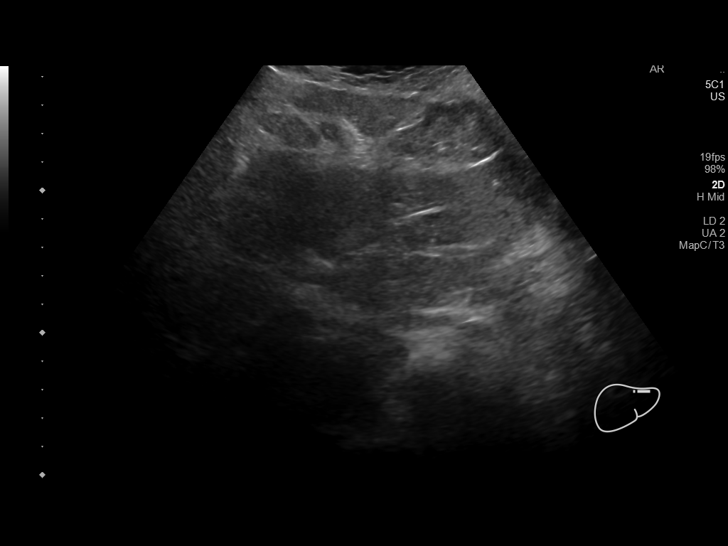
[im 29/43]
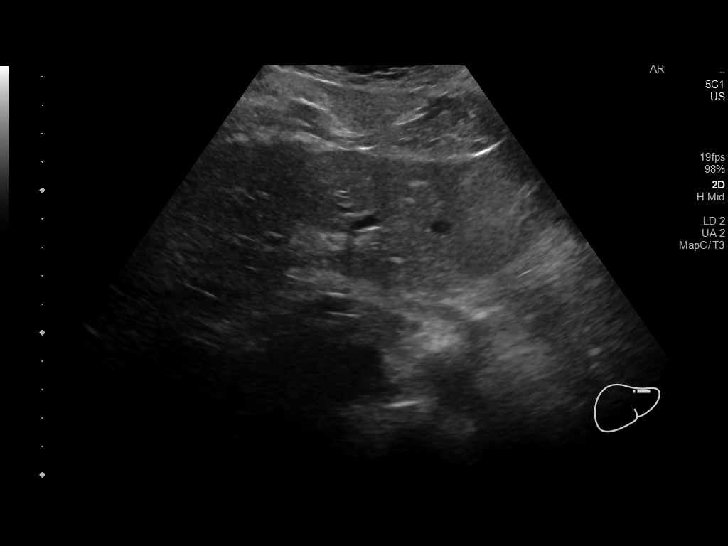
[im 32/43]
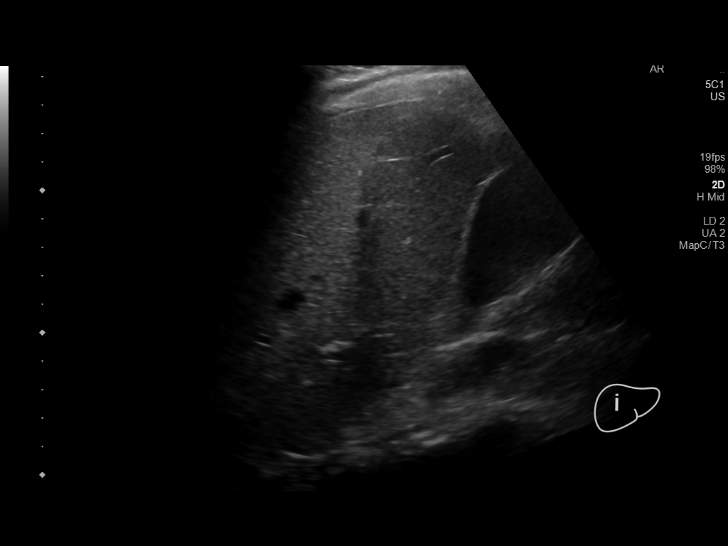
[im 36/43]
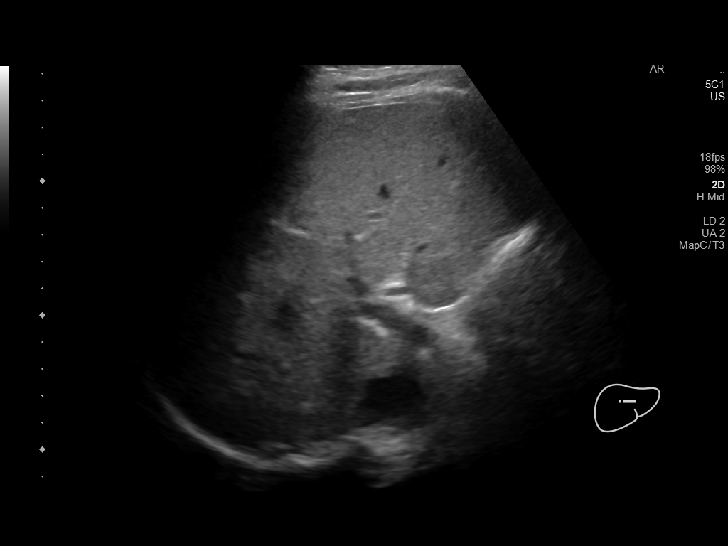
[im 39/43]
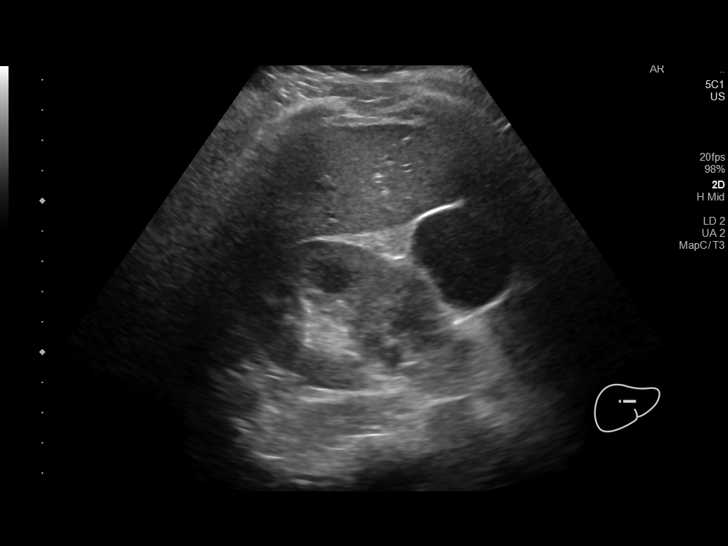
[im 43/43]
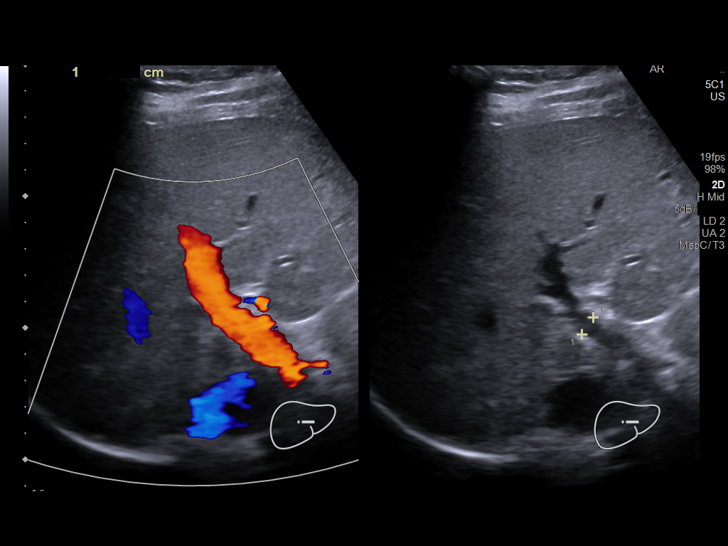

[14 of 25 positions shown; findings below may reference images not displayed]

FINDINGS: Gallbladder:

No gallstones or wall thickening visualized. No sonographic Murphy
sign noted by sonographer.

Common bile duct:

Diameter: 5 mm, normal.

Liver:

No focal lesion identified. Within normal limits in parenchymal
echogenicity. Portal vein is patent on color Doppler imaging with
normal direction of blood flow towards the liver.
IMPRESSION: Normal right upper quadrant ultrasound.

## 2019-08-30 IMAGING — DX DG CHEST 2V
2 series · 2 of 2 positions shown · non-contrast
Comparison: February 11, 2016

CLINICAL DATA: Cough.  Hyperglycemia

EXAM:
CHEST - 2 VIEW

[chest pa]
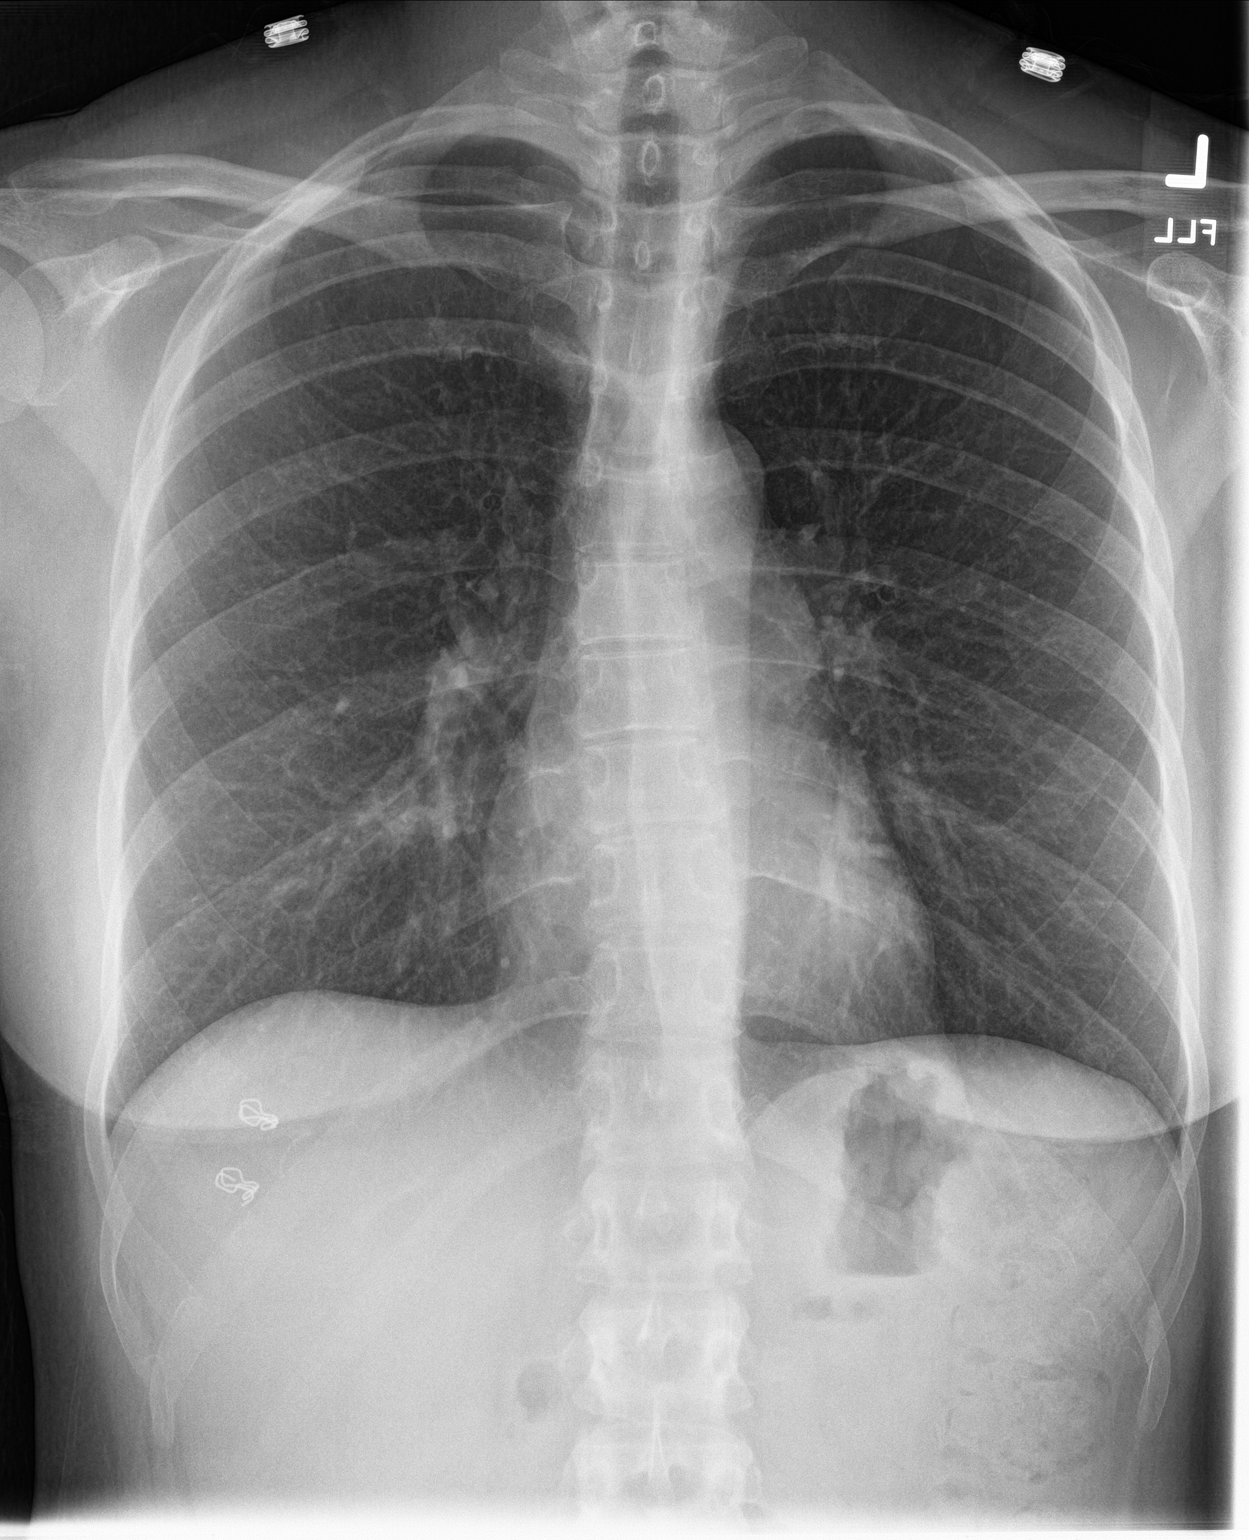

[chest lat]
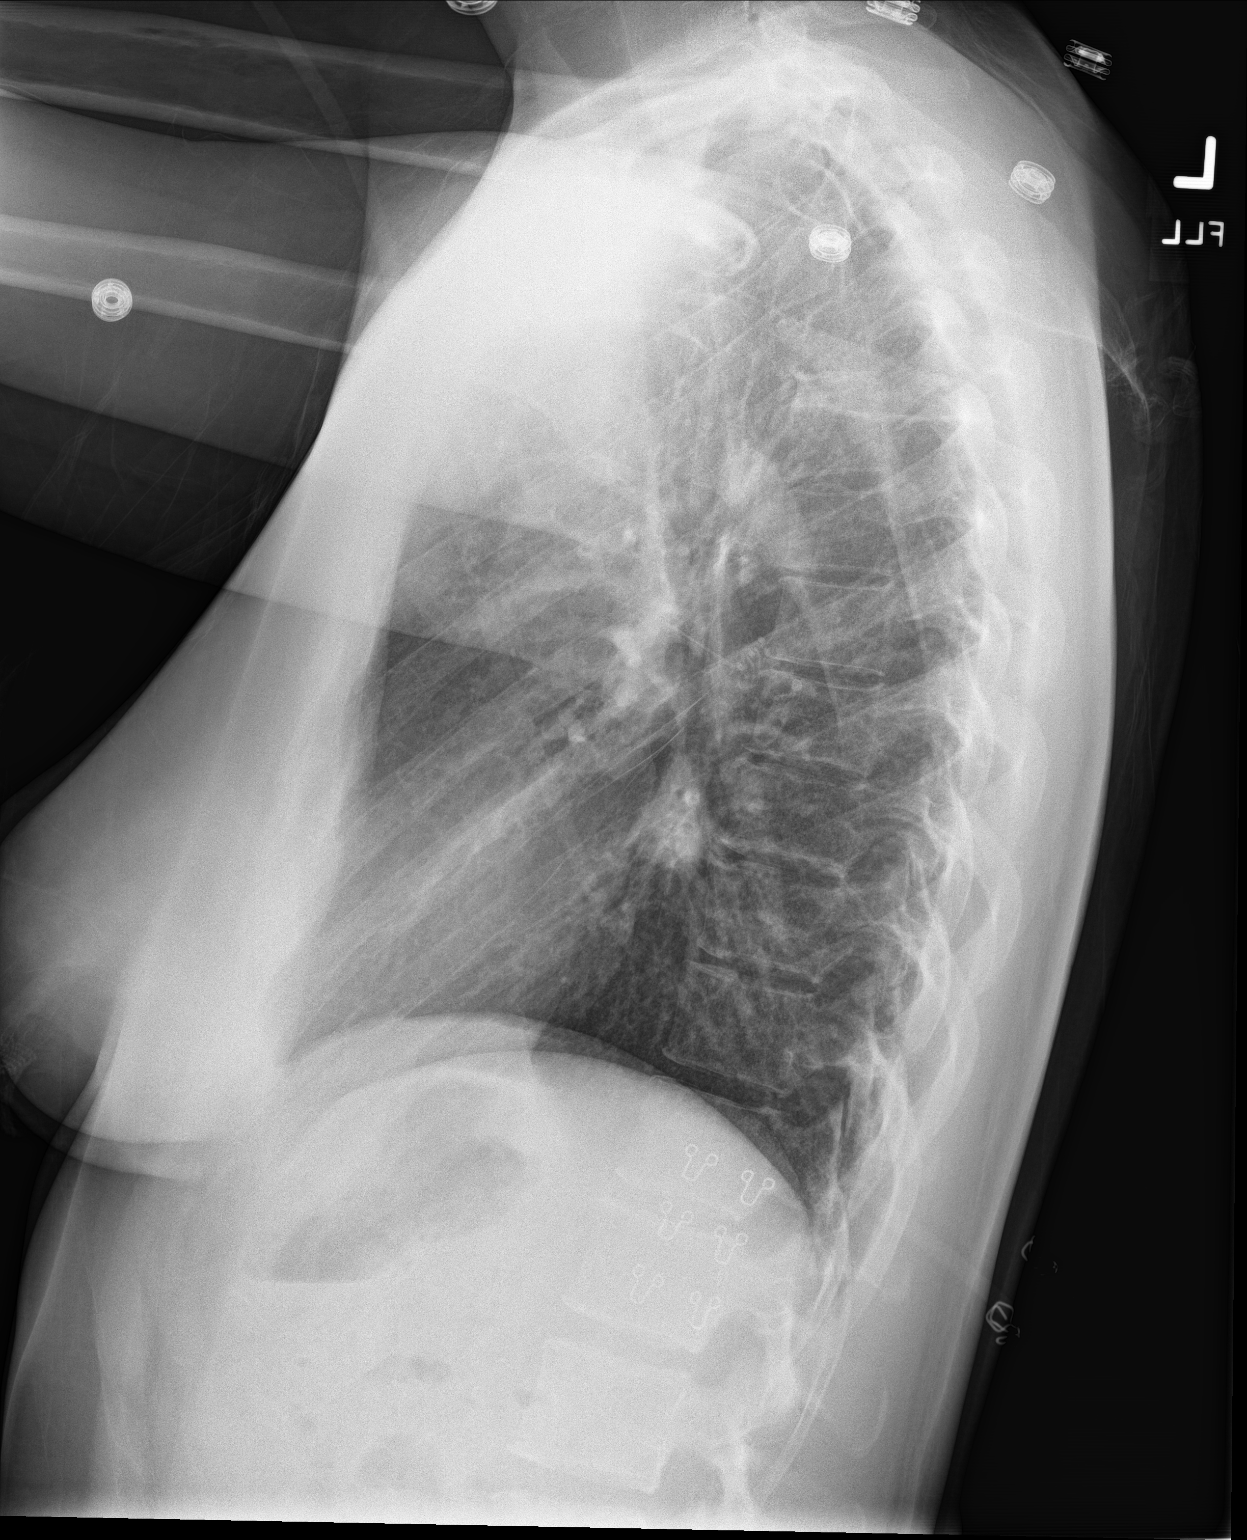

[2 of 2 positions shown; findings below may reference images not displayed]

FINDINGS: No edema or consolidation. Heart size and pulmonary vascularity are
normal. No adenopathy. No bone lesions.
IMPRESSION: No edema or consolidation.

## 2020-08-16 ENCOUNTER — Other Ambulatory Visit: Payer: Self-pay | Admitting: Family Medicine

## 2020-08-16 ENCOUNTER — Other Ambulatory Visit (HOSPITAL_COMMUNITY): Payer: Self-pay | Admitting: Family Medicine

## 2020-08-16 DIAGNOSIS — R102 Pelvic and perineal pain: Secondary | ICD-10-CM

## 2020-08-29 ENCOUNTER — Ambulatory Visit (HOSPITAL_COMMUNITY): Payer: BC Managed Care – PPO

## 2020-08-29 ENCOUNTER — Encounter (HOSPITAL_COMMUNITY): Payer: Self-pay

## 2020-09-01 ENCOUNTER — Other Ambulatory Visit: Payer: Self-pay

## 2020-09-01 ENCOUNTER — Ambulatory Visit (HOSPITAL_COMMUNITY)
Admission: RE | Admit: 2020-09-01 | Discharge: 2020-09-01 | Disposition: A | Discharge status: Home / Self Care | Payer: BC Managed Care – PPO | Source: Ambulatory Visit | Attending: Family Medicine | Admitting: Family Medicine

## 2020-09-01 DIAGNOSIS — R102 Pelvic and perineal pain: Secondary | ICD-10-CM | POA: Diagnosis not present

## 2020-11-05 ENCOUNTER — Observation Stay (HOSPITAL_COMMUNITY)
Admission: EM | Admit: 2020-11-05 | Discharge: 2020-11-06 | Disposition: A | Discharge status: Home / Self Care | Payer: BC Managed Care – PPO | Attending: Family Medicine | Admitting: Family Medicine

## 2020-11-05 ENCOUNTER — Encounter (HOSPITAL_COMMUNITY): Payer: Self-pay | Admitting: Emergency Medicine

## 2020-11-05 ENCOUNTER — Other Ambulatory Visit: Payer: Self-pay

## 2020-11-05 DIAGNOSIS — Z20822 Contact with and (suspected) exposure to covid-19: Secondary | ICD-10-CM | POA: Diagnosis not present

## 2020-11-05 DIAGNOSIS — Z87891 Personal history of nicotine dependence: Secondary | ICD-10-CM | POA: Diagnosis not present

## 2020-11-05 DIAGNOSIS — E876 Hypokalemia: Secondary | ICD-10-CM | POA: Diagnosis present

## 2020-11-05 DIAGNOSIS — O200 Threatened abortion: Secondary | ICD-10-CM | POA: Diagnosis not present

## 2020-11-05 DIAGNOSIS — E1110 Type 2 diabetes mellitus with ketoacidosis without coma: Secondary | ICD-10-CM | POA: Diagnosis present

## 2020-11-05 DIAGNOSIS — R112 Nausea with vomiting, unspecified: Secondary | ICD-10-CM | POA: Diagnosis present

## 2020-11-05 DIAGNOSIS — Z794 Long term (current) use of insulin: Secondary | ICD-10-CM | POA: Insufficient documentation

## 2020-11-05 DIAGNOSIS — Z79899 Other long term (current) drug therapy: Secondary | ICD-10-CM | POA: Diagnosis not present

## 2020-11-05 DIAGNOSIS — D72829 Elevated white blood cell count, unspecified: Secondary | ICD-10-CM | POA: Insufficient documentation

## 2020-11-05 DIAGNOSIS — I10 Essential (primary) hypertension: Secondary | ICD-10-CM | POA: Insufficient documentation

## 2020-11-05 DIAGNOSIS — E1010 Type 1 diabetes mellitus with ketoacidosis without coma: Principal | ICD-10-CM

## 2020-11-05 DIAGNOSIS — E1065 Type 1 diabetes mellitus with hyperglycemia: Secondary | ICD-10-CM

## 2020-11-05 DIAGNOSIS — E871 Hypo-osmolality and hyponatremia: Secondary | ICD-10-CM | POA: Diagnosis not present

## 2020-11-05 DIAGNOSIS — E109 Type 1 diabetes mellitus without complications: Secondary | ICD-10-CM | POA: Diagnosis present

## 2020-11-05 LAB — URINALYSIS, ROUTINE W REFLEX MICROSCOPIC
Bacteria, UA: NONE SEEN
Bilirubin Urine: NEGATIVE
Glucose, UA: >=500 mg/dL — AB
Ketones, ur: 80 mg/dL — AB
Leukocytes,Ua: NEGATIVE
Nitrite: NEGATIVE
Protein, ur: NEGATIVE mg/dL
Specific Gravity, Urine: 1.021 (ref 1.005–1.030)
pH: 5 (ref 5.0–8.0)

## 2020-11-05 LAB — BASIC METABOLIC PANEL
Anion gap: 17 — ABNORMAL HIGH (ref 5–15)
Anion gap: 12 (ref 5–15)
BUN: 28 mg/dL — ABNORMAL HIGH (ref 6–20)
BUN: 23 mg/dL — ABNORMAL HIGH (ref 6–20)
CO2: 14 mmol/L — ABNORMAL LOW (ref 22–32)
CO2: 18 mmol/L — ABNORMAL LOW (ref 22–32)
Calcium: 9.6 mg/dL (ref 8.9–10.3)
Calcium: 8.7 mg/dL — ABNORMAL LOW (ref 8.9–10.3)
Chloride: 102 mmol/L (ref 98–111)
Chloride: 103 mmol/L (ref 98–111)
Creatinine, Ser: 0.96 mg/dL (ref 0.44–1.00)
Creatinine, Ser: 0.84 mg/dL (ref 0.44–1.00)
GFR, Estimated: >60 mL/min (ref >60)
GFR, Estimated: >60 mL/min (ref >60)
Glucose, Bld: 367 mg/dL — ABNORMAL HIGH (ref 70–99)
Glucose, Bld: 197 mg/dL — ABNORMAL HIGH (ref 70–99)
Potassium: 4.5 mmol/L (ref 3.5–5.1)
Potassium: 4 mmol/L (ref 3.5–5.1)
Sodium: 133 mmol/L — ABNORMAL LOW (ref 135–145)
Sodium: 133 mmol/L — ABNORMAL LOW (ref 135–145)

## 2020-11-05 LAB — BLOOD GAS, VENOUS
Acid-base deficit: 11.5 mmol/L — ABNORMAL HIGH (ref 0.0–2.0)
Bicarbonate: 14.1 mmol/L — ABNORMAL LOW (ref 20.0–28.0)
FIO2: 21
O2 Saturation: 41.1 %
Patient temperature: 37
pCO2, Ven: 38.5 mmHg — ABNORMAL LOW (ref 44.0–60.0)
pH, Ven: 7.21 — ABNORMAL LOW (ref 7.250–7.430)
pO2, Ven: <31 mmHg — CL (ref 32.0–45.0)

## 2020-11-05 LAB — COMPREHENSIVE METABOLIC PANEL
ALT: 25 U/L (ref 0–44)
AST: 26 U/L (ref 15–41)
Albumin: 4.5 g/dL (ref 3.5–5.0)
Alkaline Phosphatase: 96 U/L (ref 38–126)
Anion gap: 19 — ABNORMAL HIGH (ref 5–15)
BUN: 27 mg/dL — ABNORMAL HIGH (ref 6–20)
CO2: 15 mmol/L — ABNORMAL LOW (ref 22–32)
Calcium: 10.4 mg/dL — ABNORMAL HIGH (ref 8.9–10.3)
Chloride: 97 mmol/L — ABNORMAL LOW (ref 98–111)
Creatinine, Ser: 1.02 mg/dL — ABNORMAL HIGH (ref 0.44–1.00)
GFR, Estimated: >60 mL/min (ref >60)
Glucose, Bld: 446 mg/dL — ABNORMAL HIGH (ref 70–99)
Potassium: 5.6 mmol/L — ABNORMAL HIGH (ref 3.5–5.1)
Sodium: 131 mmol/L — ABNORMAL LOW (ref 135–145)
Total Bilirubin: 2.1 mg/dL — ABNORMAL HIGH (ref 0.3–1.2)
Total Protein: 8.5 g/dL — ABNORMAL HIGH (ref 6.5–8.1)

## 2020-11-05 LAB — CBC WITH DIFFERENTIAL/PLATELET
Abs Immature Granulocytes: 0.09 10*3/uL — ABNORMAL HIGH (ref 0.00–0.07)
Basophils Absolute: 0.1 10*3/uL (ref 0.0–0.1)
Basophils Relative: 1 %
Eosinophils Absolute: 0 10*3/uL (ref 0.0–0.5)
Eosinophils Relative: 0 %
HCT: 45.4 % (ref 36.0–46.0)
Hemoglobin: 15.1 g/dL — ABNORMAL HIGH (ref 12.0–15.0)
Immature Granulocytes: 1 %
Lymphocytes Relative: 9 %
Lymphs Abs: 1.6 10*3/uL (ref 0.7–4.0)
MCH: 29.7 pg (ref 26.0–34.0)
MCHC: 33.3 g/dL (ref 30.0–36.0)
MCV: 89.2 fL (ref 80.0–100.0)
Monocytes Absolute: 0.6 10*3/uL (ref 0.1–1.0)
Monocytes Relative: 3 %
Neutro Abs: 15 10*3/uL — ABNORMAL HIGH (ref 1.7–7.7)
Neutrophils Relative %: 86 %
Platelets: 389 10*3/uL (ref 150–400)
RBC: 5.09 MIL/uL (ref 3.87–5.11)
RDW: 11.4 % — ABNORMAL LOW (ref 11.5–15.5)
WBC: 17.4 10*3/uL — ABNORMAL HIGH (ref 4.0–10.5)
nRBC: 0 % (ref 0.0–0.2)

## 2020-11-05 LAB — RESP PANEL BY RT-PCR (FLU A&B, COVID) ARPGX2
Influenza A by PCR: NEGATIVE
Influenza B by PCR: NEGATIVE
SARS Coronavirus 2 by RT PCR: NEGATIVE

## 2020-11-05 LAB — HEMOGLOBIN A1C
Hgb A1c MFr Bld: 10.1 % — ABNORMAL HIGH (ref 4.8–5.6)
Mean Plasma Glucose: 243.17 mg/dL

## 2020-11-05 LAB — GLUCOSE, CAPILLARY
Glucose-Capillary: 184 mg/dL — ABNORMAL HIGH (ref 70–99)
Glucose-Capillary: 195 mg/dL — ABNORMAL HIGH (ref 70–99)
Glucose-Capillary: 195 mg/dL — ABNORMAL HIGH (ref 70–99)
Glucose-Capillary: 200 mg/dL — ABNORMAL HIGH (ref 70–99)
Glucose-Capillary: 202 mg/dL — ABNORMAL HIGH (ref 70–99)
Glucose-Capillary: 204 mg/dL — ABNORMAL HIGH (ref 70–99)
Glucose-Capillary: 279 mg/dL — ABNORMAL HIGH (ref 70–99)

## 2020-11-05 LAB — CBG MONITORING, ED
Glucose-Capillary: 424 mg/dL — ABNORMAL HIGH (ref 70–99)
Glucose-Capillary: 508 mg/dL (ref 70–99)
Glucose-Capillary: 436 mg/dL — ABNORMAL HIGH (ref 70–99)
Glucose-Capillary: 341 mg/dL — ABNORMAL HIGH (ref 70–99)
Glucose-Capillary: 406 mg/dL — ABNORMAL HIGH (ref 70–99)

## 2020-11-05 LAB — MRSA NEXT GEN BY PCR, NASAL: MRSA by PCR Next Gen: DETECTED — AB

## 2020-11-05 LAB — HIV ANTIBODY (ROUTINE TESTING W REFLEX): HIV Screen 4th Generation wRfx: NONREACTIVE

## 2020-11-05 LAB — HCG, QUANTITATIVE, PREGNANCY: hCG, Beta Chain, Quant, S: <1 m[IU]/mL (ref <5)

## 2020-11-05 LAB — BETA-HYDROXYBUTYRIC ACID
Beta-Hydroxybutyric Acid: 5.93 mmol/L — ABNORMAL HIGH (ref 0.05–0.27)
Beta-Hydroxybutyric Acid: 5.87 mmol/L — ABNORMAL HIGH (ref 0.05–0.27)

## 2020-11-05 MED ORDER — ACETAMINOPHEN 325 MG PO TABS: 650.0000 mg | ORAL_TABLET | Freq: Four times a day (QID) | ORAL | Status: DC | PRN

## 2020-11-05 MED ORDER — DEXTROSE 50 % IV SOLN
0.0000 mL | INTRAVENOUS | Status: DC | PRN
Start: 2020-11-05 — End: 2020-11-06

## 2020-11-05 MED ORDER — INSULIN REGULAR(HUMAN) IN NACL 100-0.9 UT/100ML-% IV SOLN
INTRAVENOUS | Status: DC
  Administered 2020-11-06: 3.2 [IU]/h via INTRAVENOUS
  Filled 2020-11-05: qty 100

## 2020-11-05 MED ORDER — LACTATED RINGERS IV SOLN: INTRAVENOUS | Status: DC

## 2020-11-05 MED ORDER — SODIUM CHLORIDE 0.9% FLUSH
3.0000 mL | Freq: Two times a day (BID) | INTRAVENOUS | Status: DC
  Administered 2020-11-05: 3 mL via INTRAVENOUS

## 2020-11-05 MED ORDER — ADULT MULTIVITAMIN W/MINERALS CH: 1.0000 | ORAL_TABLET | Freq: Every day | ORAL | Status: DC

## 2020-11-05 MED ORDER — DEXTROSE IN LACTATED RINGERS 5 % IV SOLN: INTRAVENOUS | Status: DC

## 2020-11-05 MED ORDER — POLYETHYLENE GLYCOL 3350 17 G PO PACK: 17.0000 g | PACK | Freq: Every day | ORAL | Status: DC | PRN

## 2020-11-05 MED ORDER — SODIUM CHLORIDE 0.9 % IV SOLN: 250.0000 mL | INTRAVENOUS | Status: DC | PRN

## 2020-11-05 MED ORDER — SODIUM CHLORIDE 0.9 % IV BOLUS
1000.0000 mL | Freq: Once | INTRAVENOUS | Status: AC
  Administered 2020-11-05: 1000 mL via INTRAVENOUS

## 2020-11-05 MED ORDER — LABETALOL HCL 100 MG PO TABS
50.0000 mg | ORAL_TABLET | Freq: Every day | ORAL | Status: DC
  Administered 2020-11-05: 50 mg via ORAL
  Filled 2020-11-05 (×2): qty 0.5

## 2020-11-05 MED ORDER — DEXTROSE 50 % IV SOLN: 0.0000 mL | INTRAVENOUS | Status: DC | PRN

## 2020-11-05 MED ORDER — SODIUM CHLORIDE 0.9 % IV BOLUS: 1000.0000 mL | Freq: Once | INTRAVENOUS | Status: DC

## 2020-11-05 MED ORDER — ONDANSETRON HCL 4 MG PO TABS: 4.0000 mg | ORAL_TABLET | Freq: Four times a day (QID) | ORAL | Status: DC | PRN

## 2020-11-05 MED ORDER — ESCITALOPRAM OXALATE 10 MG PO TABS
10.0000 mg | ORAL_TABLET | Freq: Every day | ORAL | Status: DC
  Administered 2020-11-05: 10 mg via ORAL
  Filled 2020-11-05: qty 1

## 2020-11-05 MED ORDER — PANTOPRAZOLE SODIUM 40 MG PO TBEC
40.0000 mg | DELAYED_RELEASE_TABLET | Freq: Every day | ORAL | Status: DC
  Administered 2020-11-05 – 2020-11-06 (×2): 40 mg via ORAL
  Filled 2020-11-05 (×2): qty 1

## 2020-11-05 MED ORDER — TRAZODONE HCL 50 MG PO TABS: 50.0000 mg | ORAL_TABLET | Freq: Every evening | ORAL | Status: DC | PRN

## 2020-11-05 MED ORDER — NICOTINE 14 MG/24HR TD PT24
14.0000 mg | MEDICATED_PATCH | Freq: Every day | TRANSDERMAL | Status: DC
  Filled 2020-11-05: qty 1

## 2020-11-05 MED ORDER — LACTATED RINGERS IV BOLUS
20.0000 mL/kg | Freq: Once | INTRAVENOUS | Status: AC
  Administered 2020-11-05: 1478 mL via INTRAVENOUS

## 2020-11-05 MED ORDER — SODIUM CHLORIDE 0.9% FLUSH: 3.0000 mL | INTRAVENOUS | Status: DC | PRN

## 2020-11-05 MED ORDER — FLUTICASONE PROPIONATE 50 MCG/ACT NA SUSP: 2.0000 | Freq: Two times a day (BID) | NASAL | Status: DC | PRN

## 2020-11-05 MED ORDER — MORPHINE SULFATE (PF) 4 MG/ML IV SOLN
4.0000 mg | Freq: Once | INTRAVENOUS | Status: AC
  Administered 2020-11-05: 4 mg via INTRAVENOUS
  Filled 2020-11-05: qty 1

## 2020-11-05 MED ORDER — ONDANSETRON HCL 4 MG/2ML IJ SOLN
4.0000 mg | Freq: Once | INTRAMUSCULAR | Status: AC
  Administered 2020-11-05: 4 mg via INTRAVENOUS
  Filled 2020-11-05: qty 2

## 2020-11-05 MED ORDER — PRENATAL MULTIVITAMIN CH
1.0000 | ORAL_TABLET | Freq: Every day | ORAL | Status: DC
  Administered 2020-11-06: 1 via ORAL
  Filled 2020-11-05 (×2): qty 1

## 2020-11-05 MED ORDER — INSULIN REGULAR(HUMAN) IN NACL 100-0.9 UT/100ML-% IV SOLN
INTRAVENOUS | Status: DC
  Administered 2020-11-05: 10.5 [IU]/h via INTRAVENOUS
  Filled 2020-11-05: qty 100

## 2020-11-05 MED ORDER — ONDANSETRON HCL 4 MG/2ML IJ SOLN: 4.0000 mg | Freq: Four times a day (QID) | INTRAMUSCULAR | Status: DC | PRN

## 2020-11-05 MED ORDER — ENOXAPARIN SODIUM 40 MG/0.4ML IJ SOSY
40.0000 mg | PREFILLED_SYRINGE | INTRAMUSCULAR | Status: DC
  Administered 2020-11-05: 40 mg via SUBCUTANEOUS
  Filled 2020-11-05: qty 0.4

## 2020-11-05 MED ORDER — BISACODYL 10 MG RE SUPP: 10.0000 mg | Freq: Every day | RECTAL | Status: DC | PRN

## 2020-11-05 MED ORDER — ACETAMINOPHEN 650 MG RE SUPP
650.0000 mg | Freq: Four times a day (QID) | RECTAL | Status: DC | PRN
Start: 2020-11-05 — End: 2020-11-06

## 2020-11-05 NOTE — H&P (Signed)
Patient Demographics:    Norma Allen, is a 35 y.o. female  MRN: [id number]   DOB - Nov 03, 1985  Admit Date - 11/05/2020  Outpatient Primary MD for the patient is Celene Squibb, MD   Assessment & Plan:    Principal Problem:   DKA (diabetic ketoacidosis) ([place]) Active Problems:   Hypokalemia   Diabetes mellitus type I ([place])   Hyponatremia   Uncontrolled type 1 diabetes mellitus with hyperglycemia ([place])   1)DKA--patient met DKA criteria on admission with a bicarb of 15, anion gap of 19, serum glucose of 446, beta hydroxybutyric acid of 5.93,VBG with PH is 7.21 -Treat with aggressive IV fluids, IV insulin, glucose check, frequent BMP and beta hydroxybutyric acid checks per Endo tool protocol -May switch from LR/NS to D5 solution when glucose drops below 250, per Endo tool protocol --Hold PTA Insulin Pump -Last episode of DKA was back in 2019  2)PseudoHypoNatremia and Hyperkalemia--- Na is 131 (glucose is 446) and Potassium is 5.6-- IV fluids and iv insulin as ordered Serial BMPs as ordered----  3)Depression-- c/n Lexapro,   4)HTN---- c/n Labetalol 100 mg qhs   5) leukocytosis--- WBC 17.4 suspect reactive due to DKA  Disposition/Need for in-Hospital Stay- patient unable to be discharged at this time due to DKA with electrolyte abnormalities requiring IV insulin and IV fluids*  Dispo: The patient is from: Home              Anticipated d/c is to: Home              Anticipated d/c date is: 1 day              Patient currently is not medically stable to d/c. Barriers: Not Clinically Stable-    With History of - Reviewed by me  Past Medical History:  Diagnosis Date   Depression    meds 3 years ago   Diabetes mellitus type I ([place])    DKA (diabetic ketoacidoses)    Hx of varicella    Missed abortion  05/21/2018   SVD (spontaneous vaginal delivery) 10/08/2014      Past Surgical History:  Procedure Laterality Date   DILATION AND EVACUATION N/A 05/22/2018   Procedure: DILATATION AND EVACUATION;  Surgeon: Janyth Contes, MD;  Location: [facility];  Service: Gynecology;  Laterality: N/A;   WISDOM TOOTH EXTRACTION       Chief Complaint  Patient presents with   Diabetic Ketoacidosis      HPI:    Norma Allen  is a 35 y.o. female referred smoker with past medical history relevant for DM and type 1 diabetes on insulin pump with good compliance PTA presents with complaints of nausea vomiting , generalized aches and pains and abdominal discomfort x1 day. No fever  Or chills  -Emesis was without blood or bile, no diarrhea --In the ED patient is found to meet DKA criteria- patient met DKA criteria on admission with a bicarb  of 15, anion gap of 19, serum glucose of 446, beta hydroxybutyric acid of 5.93,VBG with PH is 7.21 -EDP gave IV fluids and started patient on IV insulin -WBC 17.4, UA pending -Last episode of DKA was back in 2019 -Sodium is low at 131, potassium 5.6 -EDP requested hospitalist admission -Additional history obtained from patient's husband at bedside    Review of systems:    In addition to the HPI above,   A full Review of  Systems was done, all other systems reviewed are negative except as noted above in HPI , .    Social History:  Reviewed by me    Social History   Tobacco Use   Smoking status: Former    Packs/day: 0.50    Years: 4.00    Pack years: 2.00    Types: Cigarettes    Quit date: 03/07/2014    Years since quitting: 6.6   Smokeless tobacco: Never  Substance Use Topics   Alcohol use: No     Family History :  Reviewed by me    Family History  Problem Relation Age of Onset   Cancer Mother    Hypertension Mother    Diabetes Father    Hypertension Father      Home Medications:   Prior to Admission medications   Medication Sig Start  Date End Date Taking? Authorizing Provider  escitalopram (LEXAPRO) 10 MG tablet Take 10 mg by mouth at bedtime. 05/26/20  Yes [provider]  fluticasone (FLONASE) 50 MCG/ACT nasal spray Place 2 sprays into both nostrils 2 (two) times daily as needed (allergies.).  03/04/17  Yes [provider]  Insulin Human (INSULIN PUMP) SOLN Continue home dose [place] insulin with basal rate totalling 36 units/24 hours 02/02/18  Yes Tat, David, MD  labetalol (NORMODYNE) 100 MG tablet Take 50 mg by mouth at bedtime. 05/26/20  Yes [provider]  NOVOLOG 100 UNIT/ML injection Inject 100-135 Units into the skin daily. 11/03/20  Yes [provider]  omeprazole (PRILOSEC) 20 MG capsule Take 20 mg by mouth daily. 08/25/20  Yes [provider]  Prenatal Vit-Fe Fumarate-FA (PRENATAL MULTIVITAMIN) TABS tablet Take 1 tablet by mouth daily.   Yes [provider]  ibuprofen (ADVIL,MOTRIN) 800 MG tablet Take 1 tablet (800 mg total) by mouth every 8 (eight) hours as needed for moderate pain. Patient not taking: Reported on 11/05/2020 05/22/18   Janyth Contes, MD     Allergies:    No Known Allergies   Physical Exam:   Vitals  Blood pressure 112/60, pulse (!) 111, temperature 98.6 F (37 C), temperature source Oral, resp. rate 19, height $RemoveBe'5\' 6"'rVQtzPADC$  (1.676 m), weight 73.9 kg, last menstrual period 10/25/2020, SpO2 100 %, unknown if currently breastfeeding.  Physical Examination: General appearance - alert, and in no distress  Mental status - alert, oriented to person, place, and time,  Eyes - sclera anicteric Neck - supple, no JVD elevation , Chest - clear  to auscultation bilaterally, symmetrical air movement,  Heart - S1 and S2 normal, regular  Abdomen - soft, nontender, nondistended, no masses or organomegaly Neurological - screening mental status exam normal, neck supple without rigidity, cranial nerves II through XII intact, DTR's normal and symmetric Extremities -  no pedal edema noted, intact peripheral pulses  Skin -very dry skin and dry mucosa      Data Review:    CBC Recent Labs  Lab 11/05/20 1323  WBC 17.4*  HGB 15.1*  HCT 45.4  PLT  389  MCV 89.2  MCH 29.7  MCHC 33.3  RDW 11.4*  LYMPHSABS 1.6  MONOABS 0.6  EOSABS 0.0  BASOSABS 0.1   ------------------------------------------------------------------------------------------------------------------  Chemistries  Recent Labs  Lab 11/05/20 1323  NA 131*  K 5.6*  CL 97*  CO2 15*  GLUCOSE 446*  BUN 27*  CREATININE 1.02*  CALCIUM 10.4*  AST 26  ALT 25  ALKPHOS 96  BILITOT 2.1*   ------------------------------------------------------------------------------------------------------------------ estimated creatinine clearance is 79.9 mL/min (A) (by C-G formula based on SCr of 1.02 mg/dL (H)). ------------------------------------------------------------------------------------------------------------------ No results for input(s): TSH, T4TOTAL, T3FREE, THYROIDAB in the last 72 hours.  Invalid input(s): FREET3   Coagulation profile No results for input(s): INR, PROTIME in the last 168 hours. ------------------------------------------------------------------------------------------------------------------- No results for input(s): DDIMER in the last 72 hours. -------------------------------------------------------------------------------------------------------------------  Cardiac Enzymes No results for input(s): CKMB, TROPONINI, MYOGLOBIN in the last 168 hours.  Invalid input(s): CK ------------------------------------------------------------------------------------------------------------------ No results found for: BNP   ---------------------------------------------------------------------------------------------------------------  Urinalysis    Component Value Date/Time   COLORURINE STRAW (A) 01/31/2018 [place] 01/31/2018 1139   LABSPEC 1.027  01/31/2018 1139   PHURINE 5.0 01/31/2018 1139   GLUCOSEU >=500 (A) 01/31/2018 1139   HGBUR NEGATIVE 01/31/2018 1139   BILIRUBINUR NEGATIVE 01/31/2018 1139   KETONESUR 80 (A) 01/31/2018 1139   PROTEINUR NEGATIVE 01/31/2018 1139   UROBILINOGEN 1.0 10/07/2014 0945   NITRITE NEGATIVE 01/31/2018 1139   LEUKOCYTESUR NEGATIVE 01/31/2018 1139     Imaging Results:    No results found.  Radiological Exams on Admission: No results found.  DVT Prophylaxis -SCD/lovenox AM Labs Ordered, also please review Full Orders  Family Communication: Admission, patients condition and plan of care including tests being ordered have been discussed with the patient who indicate understanding and agree with the plan   Code Status - Full Code  Likely DC to home after resolution of DKA pathophysiology  Condition   stable  Roxan Hockey M.D on 11/05/2020 at 3:33 PM Go to [email or web address] -  for contact info  [facility] - Office  [phone]

## 2020-11-05 NOTE — ED Triage Notes (Signed)
C/o body pain, nausea.  History of DM1, pt has on insulin pump in [place].  Pt not able to obtain glucose level.

## 2020-11-05 NOTE — ED Notes (Signed)
Pt vomited.   

## 2020-11-05 NOTE — ED Provider Notes (Signed)
[facility] EMERGENCY DEPARTMENT Provider Note   CSN: [id number] Arrival date & time: 11/05/20  1258     History Chief Complaint  Patient presents with   Diabetic Ketoacidosis    Norma Allen is a 35 y.o. female with past medical history of type 1 diabetes with insulin pump and DKA.   HPI Patient presents to emergency department today with chief complaint of  presenting to emergency department today with chief complaint of generalized body aches and nausea x 1 day.  Patient states last night she began to not feel well however just had vague symptoms.  She states when she woke up this morning she had severe nausea and pain in bilateral arms.  She describes the pain as sharp and shooting pain is constant.  She states she tried to take a shower to see if that would help her feel better and while in there had 1 episode of nonbloody nonbilious emesis.  Patient states she has felt too weak to check her blood sugar today.  She denies any problems with her pump and admits to medication compliance.  She last had an episode of DKA in 2019, she admits that this feels similar.  She denies any known COVID exposure.  Denies any fever, chills, cough, chest pain, shortness of breath, abdominal pain, urinary symptoms, diarrhea.       Past Medical History:  Diagnosis Date   Depression    meds 3 years ago   Diabetes mellitus type I ([facility])    DKA (diabetic ketoacidoses)    Hx of varicella    Missed abortion 05/21/2018   SVD (spontaneous vaginal delivery) 10/08/2014    Patient Active Problem List   Diagnosis Date Noted   Missed abortion 05/21/2018   Uncontrolled type 1 diabetes mellitus with hyperglycemia ([facility]) 02/02/2018   Tobacco abuse 02/01/2018   Hyperbilirubinemia    SVD (spontaneous vaginal delivery) 10/08/2014   Pre-eclampsia 10/07/2014   DKA (diabetic ketoacidoses) ([facility]) 06/23/2013   Hyperkalemia 06/23/2013   Hyponatremia 06/23/2013   Tachycardia 06/23/2013   Nausea 06/23/2013    Diabetes mellitus type I ([facility])    Depression     Past Surgical History:  Procedure Laterality Date   DILATION AND EVACUATION N/A 05/22/2018   Procedure: DILATATION AND EVACUATION;  Surgeon: Sherian Rein, MD;  Location: [facility];  Service: Gynecology;  Laterality: N/A;   WISDOM TOOTH EXTRACTION       OB History     Gravida  1   Para  1   Term  1   Preterm      AB      Living  1      SAB      IAB      Ectopic      Multiple  0   Live Births  1           Family History  Problem Relation Age of Onset   Cancer Mother    Hypertension Mother    Diabetes Father    Hypertension Father     Social History   Tobacco Use   Smoking status: Former    Packs/day: 0.50    Years: 4.00    Pack years: 2.00    Types: Cigarettes    Quit date: 03/07/2014    Years since quitting: 6.6   Smokeless tobacco: Never  Vaping Use   Vaping Use: Never used  Substance Use Topics   Alcohol use: No   Drug use: No  Home Medications Prior to Admission medications   Medication Sig Start Date End Date Taking? Authorizing Provider  fluticasone (FLONASE) 50 MCG/ACT nasal spray Place 2 sprays into both nostrils 2 (two) times daily as needed (allergies.).  03/04/17   [provider]  ibuprofen (ADVIL,MOTRIN) 800 MG tablet Take 1 tablet (800 mg total) by mouth every 8 (eight) hours as needed for moderate pain. 05/22/18   Bovard-Stuckert, Augusto Gamble, MD  Insulin Human (INSULIN PUMP) SOLN Continue home dose [place] insulin with basal rate totalling 36 units/24 hours 02/02/18   Tat, Onalee Hua, MD  Prenatal Vit-Fe Fumarate-FA (PRENATAL MULTIVITAMIN) TABS tablet Take 1 tablet by mouth daily.    [provider]    Allergies    Patient has no known allergies.  Review of Systems   Review of Systems All other systems are reviewed and are negative for acute change except as noted in the HPI.  Physical Exam Updated Vital Signs BP (!) 107/58   Pulse (!) 115   Temp 98.6 F (37  C) (Oral)   Resp 20   Ht 5\' 6"  (1.676 m)   Wt 73.9 kg   LMP 10/25/2020 (Exact Date)   SpO2 100%   BMI 26.31 kg/m   Physical Exam Vitals and nursing note reviewed.  Constitutional:      General: She is not in acute distress.    Appearance: She is ill-appearing.  HENT:     Head: Normocephalic and atraumatic.     Right Ear: External ear normal.     Left Ear: External ear normal.     Nose: Nose normal.     Mouth/Throat:     Mouth: Mucous membranes are dry.     Pharynx: Oropharynx is clear.  Eyes:     General: No scleral icterus.       Right eye: No discharge.        Left eye: No discharge.     Extraocular Movements: Extraocular movements intact.     Conjunctiva/sclera: Conjunctivae normal.     Pupils: Pupils are equal, round, and reactive to light.  Neck:     Vascular: No JVD.  Cardiovascular:     Rate and Rhythm: Regular rhythm. Tachycardia present.     Pulses: Normal pulses.          Radial pulses are 2+ on the right side and 2+ on the left side.     Heart sounds: Normal heart sounds.  Pulmonary:     Comments: Lungs clear to auscultation in all fields. Symmetric chest rise. No wheezing, rales, or rhonchi. Abdominal:     Comments: Abdomen is soft, non-distended, and non-tender in all quadrants. No rigidity, no guarding. No peritoneal signs.  Musculoskeletal:        General: Normal range of motion.     Cervical back: Normal range of motion.  Skin:    General: Skin is warm and dry.     Capillary Refill: Capillary refill takes less than 2 seconds.  Neurological:     Mental Status: She is oriented to person, place, and time.     GCS: GCS eye subscore is 4. GCS verbal subscore is 5. GCS motor subscore is 6.     Comments: Fluent speech, no facial droop.  Psychiatric:        Behavior: Behavior normal.    ED Results / Procedures / Treatments   Labs (all labs ordered are listed, but only abnormal results are displayed) Labs Reviewed  CBC WITH DIFFERENTIAL/PLATELET -  Abnormal; Notable  for the following components:      Result Value   WBC 17.4 (*)    Hemoglobin 15.1 (*)    RDW 11.4 (*)    Neutro Abs 15.0 (*)    Abs Immature Granulocytes 0.09 (*)    All other components within normal limits  COMPREHENSIVE METABOLIC PANEL - Abnormal; Notable for the following components:   Sodium 131 (*)    Potassium 5.6 (*)    Chloride 97 (*)    CO2 15 (*)    Glucose, Bld 446 (*)    BUN 27 (*)    Creatinine, Ser 1.02 (*)    Calcium 10.4 (*)    Total Protein 8.5 (*)    Total Bilirubin 2.1 (*)    Anion gap 19 (*)    All other components within normal limits  BLOOD GAS, VENOUS - Abnormal; Notable for the following components:   pH, Ven 7.210 (*)    pCO2, Ven 38.5 (*)    pO2, Ven <31.0 (*)    Bicarbonate 14.1 (*)    Acid-base deficit 11.5 (*)    All other components within normal limits  BETA-HYDROXYBUTYRIC ACID - Abnormal; Notable for the following components:   Beta-Hydroxybutyric Acid 5.93 (*)    All other components within normal limits  CBG MONITORING, ED - Abnormal; Notable for the following components:   Glucose-Capillary 424 (*)    All other components within normal limits  CBG MONITORING, ED - Abnormal; Notable for the following components:   Glucose-Capillary 508 (*)    All other components within normal limits  RESP PANEL BY RT-PCR (FLU A&B, COVID) ARPGX2  HCG, QUANTITATIVE, PREGNANCY  URINALYSIS, ROUTINE W REFLEX MICROSCOPIC  BASIC METABOLIC PANEL  BASIC METABOLIC PANEL  BASIC METABOLIC PANEL    EKG None  Radiology No results found.  Procedures .Critical Care  Date/Time: 11/05/2020 1:57 PM Performed by: Shanon AceWalisiewicz, Gwyndolyn Guilford E, PA-C Authorized by: Shanon AceWalisiewicz, Haillie Radu E, PA-C   Critical care provider statement:    Critical care time (minutes):  34   Critical care was necessary to treat or prevent imminent or life-threatening deterioration of the following conditions:  Endocrine crisis   Critical care was time spent personally by me  on the following activities:  Evaluation of patient's response to treatment, examination of patient, development of treatment plan with patient or surrogate, obtaining history from patient or surrogate, ordering and performing treatments and interventions, ordering and review of laboratory studies, pulse oximetry, re-evaluation of patient's condition and review of old charts   Care discussed with: admitting provider     Medications Ordered in ED Medications  insulin regular, human (MYXREDLIN) 100 units/ 100 mL infusion (has no administration in time range)  lactated ringers infusion (has no administration in time range)  dextrose 5 % in lactated ringers infusion (has no administration in time range)  dextrose 50 % solution 0-50 mL (has no administration in time range)  ondansetron (ZOFRAN) injection 4 mg (4 mg Intravenous Given 11/05/20 1401)  morphine 4 MG/ML injection 4 mg (4 mg Intravenous Given 11/05/20 1401)  lactated ringers bolus 1,478 mL (1,478 mLs Intravenous New Bag/Given 11/05/20 1358)    ED Course  I have reviewed the triage vital signs and the nursing notes.  Pertinent labs & imaging results that were available during my care of the patient were reviewed by me and considered in my medical decision making (see chart for details).    MDM Rules/Calculators/A&P  History provided by patient with additional history obtained from chart review.    Patient presenting with concern for DKA.  She is afebrile and tachycardic in triage.  Patient is ill-appearing, she has normal mentation, alert and oriented.  Patient is nontoxic appearing.  She has dry mucous membranes.  No abdominal tenderness.  With concern for hyperglycemia versus DKA labs ordered.  CBC with leukocytosis of 17.4, normal platelets.  Hyperkalemia 5.6, bicarb of 15, glucose of 446 BUN/creatinine slightly bumped at 27/1.029.  Acidosis of 7.2 with a PO2 less than 31.  Endo tool utilized for DKA and type I  diabetic.  Patient has turned off her insulin pump.  Will give IV insulin with morphine and nausea for pain.  EKG with normal T waves, does have borderline prolonged QT at 403.  Discussed with ED attending Dr. Estell Harpin who agrees with plan for one-time dose of Zofran here given she has vomiting and persistent nausea. Pregnancy test is negative.  Spoke with Dr. Jayme Cloud with hospitalist service who agrees to assume care of patient and bring into the hospital for further evaluation and management.      Portions of this note were generated with Scientist, clinical (histocompatibility and immunogenetics). Dictation errors may occur despite best attempts at proofreading.  Final Clinical Impression(s) / ED Diagnoses Final diagnoses:  Diabetic ketoacidosis without coma associated with type 1 diabetes mellitus [facility])    Rx / DC Orders ED Discharge Orders     None        Kandice Hams 11/05/20 1449    Bethann Berkshire, MD 11/07/20 [phone]

## 2020-11-05 NOTE — ED Notes (Signed)
Date and time results received: 11/05/20 1348  Test: PO2 Critical Value: less than 31  Name of Provider Notified: Otho Ket PA  Orders Received? Or Actions Taken?: notified

## 2020-11-05 NOTE — ED Notes (Signed)
Gaurdin is attached but is not giving insulin or checking blood sugar at this time per pt.

## 2020-11-06 DIAGNOSIS — E876 Hypokalemia: Secondary | ICD-10-CM | POA: Diagnosis not present

## 2020-11-06 DIAGNOSIS — E1065 Type 1 diabetes mellitus with hyperglycemia: Secondary | ICD-10-CM | POA: Diagnosis not present

## 2020-11-06 DIAGNOSIS — E871 Hypo-osmolality and hyponatremia: Secondary | ICD-10-CM | POA: Diagnosis not present

## 2020-11-06 DIAGNOSIS — E1010 Type 1 diabetes mellitus with ketoacidosis without coma: Secondary | ICD-10-CM | POA: Diagnosis not present

## 2020-11-06 LAB — BASIC METABOLIC PANEL
Anion gap: 4 — ABNORMAL LOW (ref 5–15)
Anion gap: 4 — ABNORMAL LOW (ref 5–15)
BUN: 18 mg/dL (ref 6–20)
BUN: 20 mg/dL (ref 6–20)
CO2: 21 mmol/L — ABNORMAL LOW (ref 22–32)
CO2: 24 mmol/L (ref 22–32)
Calcium: 8.1 mg/dL — ABNORMAL LOW (ref 8.9–10.3)
Calcium: 8.4 mg/dL — ABNORMAL LOW (ref 8.9–10.3)
Chloride: 106 mmol/L (ref 98–111)
Chloride: 107 mmol/L (ref 98–111)
Creatinine, Ser: 0.6 mg/dL (ref 0.44–1.00)
Creatinine, Ser: 0.69 mg/dL (ref 0.44–1.00)
GFR, Estimated: >60 mL/min (ref >60)
GFR, Estimated: >60 mL/min (ref >60)
Glucose, Bld: 150 mg/dL — ABNORMAL HIGH (ref 70–99)
Glucose, Bld: 216 mg/dL — ABNORMAL HIGH (ref 70–99)
Potassium: 3.7 mmol/L (ref 3.5–5.1)
Potassium: 4 mmol/L (ref 3.5–5.1)
Sodium: 131 mmol/L — ABNORMAL LOW (ref 135–145)
Sodium: 135 mmol/L (ref 135–145)

## 2020-11-06 LAB — CBC
HCT: 36.9 % (ref 36.0–46.0)
Hemoglobin: 12.6 g/dL (ref 12.0–15.0)
MCH: 29.6 pg (ref 26.0–34.0)
MCHC: 34.1 g/dL (ref 30.0–36.0)
MCV: 86.8 fL (ref 80.0–100.0)
Platelets: 307 10*3/uL (ref 150–400)
RBC: 4.25 MIL/uL (ref 3.87–5.11)
RDW: 11.5 % (ref 11.5–15.5)
WBC: 16.2 10*3/uL — ABNORMAL HIGH (ref 4.0–10.5)
nRBC: 0 % (ref 0.0–0.2)

## 2020-11-06 LAB — GLUCOSE, CAPILLARY
Glucose-Capillary: 210 mg/dL — ABNORMAL HIGH (ref 70–99)
Glucose-Capillary: 209 mg/dL — ABNORMAL HIGH (ref 70–99)
Glucose-Capillary: 171 mg/dL — ABNORMAL HIGH (ref 70–99)
Glucose-Capillary: 147 mg/dL — ABNORMAL HIGH (ref 70–99)
Glucose-Capillary: 164 mg/dL — ABNORMAL HIGH (ref 70–99)
Glucose-Capillary: 127 mg/dL — ABNORMAL HIGH (ref 70–99)
Glucose-Capillary: 193 mg/dL — ABNORMAL HIGH (ref 70–99)

## 2020-11-06 LAB — BETA-HYDROXYBUTYRIC ACID
Beta-Hydroxybutyric Acid: 0.19 mmol/L (ref 0.05–0.27)
Beta-Hydroxybutyric Acid: 0.72 mmol/L — ABNORMAL HIGH (ref 0.05–0.27)

## 2020-11-06 LAB — COMPREHENSIVE METABOLIC PANEL
ALT: 17 U/L (ref 0–44)
AST: 18 U/L (ref 15–41)
Albumin: 3.2 g/dL — ABNORMAL LOW (ref 3.5–5.0)
Alkaline Phosphatase: 53 U/L (ref 38–126)
Anion gap: 4 — ABNORMAL LOW (ref 5–15)
BUN: 19 mg/dL (ref 6–20)
CO2: 23 mmol/L (ref 22–32)
Calcium: 8.4 mg/dL — ABNORMAL LOW (ref 8.9–10.3)
Chloride: 108 mmol/L (ref 98–111)
Creatinine, Ser: 0.59 mg/dL (ref 0.44–1.00)
GFR, Estimated: >60 mL/min (ref >60)
Glucose, Bld: 171 mg/dL — ABNORMAL HIGH (ref 70–99)
Potassium: 4 mmol/L (ref 3.5–5.1)
Sodium: 135 mmol/L (ref 135–145)
Total Bilirubin: 0.8 mg/dL (ref 0.3–1.2)
Total Protein: 5.9 g/dL — ABNORMAL LOW (ref 6.5–8.1)

## 2020-11-06 MED ORDER — CHLORHEXIDINE GLUCONATE CLOTH 2 % EX PADS: 6.0000 | MEDICATED_PAD | Freq: Every day | CUTANEOUS | Status: DC

## 2020-11-06 MED ORDER — NICOTINE 14 MG/24HR TD PT24: 14.0000 mg | MEDICATED_PATCH | Freq: Every day | TRANSDERMAL | 0 refills | Status: DC

## 2020-11-06 MED ORDER — INSULIN ASPART 100 UNIT/ML IJ SOLN
0.0000 [IU] | Freq: Three times a day (TID) | INTRAMUSCULAR | Status: DC
  Administered 2020-11-06: 3 [IU] via SUBCUTANEOUS

## 2020-11-06 MED ORDER — INSULIN ASPART 100 UNIT/ML IJ SOLN: 0.0000 [IU] | Freq: Every day | INTRAMUSCULAR | Status: DC

## 2020-11-06 MED ORDER — INSULIN ASPART 100 UNIT/ML IJ SOLN: 10.0000 [IU] | Freq: Once | INTRAMUSCULAR | Status: DC

## 2020-11-06 MED ORDER — INSULIN GLARGINE 100 UNIT/ML ~~LOC~~ SOLN
10.0000 [IU] | Freq: Once | SUBCUTANEOUS | Status: DC
  Filled 2020-11-06: qty 0.1

## 2020-11-06 MED ORDER — MUPIROCIN 2 % EX OINT: 1.0000 "application " | TOPICAL_OINTMENT | Freq: Two times a day (BID) | CUTANEOUS | Status: DC

## 2020-11-06 MED ORDER — INSULIN GLARGINE 100 UNIT/ML ~~LOC~~ SOLN
10.0000 [IU] | Freq: Every day | SUBCUTANEOUS | Status: DC
  Administered 2020-11-06: 10 [IU] via SUBCUTANEOUS
  Filled 2020-11-06: qty 0.1

## 2020-11-06 MED ORDER — INSULIN ASPART 100 UNIT/ML IJ SOLN: 4.0000 [IU] | Freq: Three times a day (TID) | INTRAMUSCULAR | Status: DC

## 2020-11-06 MED ORDER — ACETAMINOPHEN 325 MG PO TABS: 650.0000 mg | ORAL_TABLET | Freq: Four times a day (QID) | ORAL | 0 refills | Status: DC | PRN

## 2020-11-06 NOTE — Progress Notes (Signed)
DKA Serum glucose 171, bicarb 23 She was started on insulin drip, IV NS with IV potassium per DKA protocol in the ED Anion gap already closed, currently at 4 Continue IV D5 LR and maintain blood glucose within 200-250 mg/dL Clear liquid consistent carb diet will be provided Subcu insulin 10 units x 1 will be given Continue IV D5 LR for about 2 hours after subcu insulin and check BMP prior to discontinuing IV drip Notify physician if patient is able to tolerate above without vomiting and patient will be started on basal insulin regimen and sliding scale If patient continues to have nausea/vomiting, patient may require further IV D5 LR and titrated insulin drip pending better control of nausea/vomiting.

## 2020-11-06 NOTE — Progress Notes (Signed)
Lantus administered at 0620. Insulin infusion to be stopped at 0820. Continue to monitor glucose.

## 2020-11-06 NOTE — Discharge Instructions (Signed)
1)Avoid ibuprofen/Advil/Aleve/Motrin/Goody Powders/Naproxen/BC powders/Meloxicam/Diclofenac/Indomethacin and other Nonsteroidal anti-inflammatory medications as these will make you more likely to bleed and can cause stomach ulcers, can also cause Kidney problems.   2)Drink Plenty fluids and check your blood sugars at least 4 times a day   3)follow up with  Endocrinologist Dr. Purcell Nails, MD, Endocrinology, Diabetes & Metabolism-- [facility], [address] [place], [place] [address], Phone Number- tel:[phone]  4)Avoidance of Tobacco strongly advised

## 2020-11-06 NOTE — Discharge Summary (Signed)
Norma Allen, is a 35 y.o. female  DOB 09-07-1985  MRN [id number].  Admission date:  11/05/2020  Admitting Physician  Lawrie Tunks Denton Brick, MD  Discharge Date:  11/06/2020   Primary MD  Celene Squibb, MD  Recommendations for primary care physician for things to follow:   1)Avoid ibuprofen/Advil/Aleve/Motrin/Goody Powders/Naproxen/BC powders/Meloxicam/Diclofenac/Indomethacin and other Nonsteroidal anti-inflammatory medications as these will make you more likely to bleed and can cause stomach ulcers, can also cause Kidney problems.   2)Drink Plenty fluids and check your blood sugars at least 4 times a day   3)follow up with  Endocrinologist Dr. Loni Beckwith, MD, Endocrinology, Diabetes & Metabolism-- [facility], [place], [place] [address], Phone Number- [phone]  4)Avoidance of Tobacco strongly advised   Admission Diagnosis  DKA (diabetic ketoacidosis) ([place]) [E11.10] Diabetic ketoacidosis without coma associated with type 1 diabetes mellitus ([place]) [E10.10]   Discharge Diagnosis  DKA (diabetic ketoacidosis) ([place]) [E11.10] Diabetic ketoacidosis without coma associated with type 1 diabetes mellitus ([place]) [E10.10]    Principal Problem:   DKA (diabetic ketoacidosis) ([place]) Active Problems:   Hypokalemia   Diabetes mellitus type I ([place])   Hyponatremia   Uncontrolled type 1 diabetes mellitus with hyperglycemia ([place])      Past Medical History:  Diagnosis Date   Depression    meds 3 years ago   Diabetes mellitus type I ([place])    DKA (diabetic ketoacidoses)    Hx of varicella    Missed abortion 05/21/2018   SVD (spontaneous vaginal delivery) 10/08/2014    Past Surgical History:  Procedure Laterality Date   DILATION AND EVACUATION N/A 05/22/2018   Procedure: DILATATION AND EVACUATION;  Surgeon: Janyth Contes, MD;  Location: [facility];  Service: Gynecology;  Laterality: N/A;    WISDOM TOOTH EXTRACTION       HPI  from the history and physical done on the day of admission:   Norma Allen  is a 35 y.o. female referred smoker with past medical history relevant for DM and type 1 diabetes on insulin pump with good compliance PTA presents with complaints of nausea vomiting , generalized aches and pains and abdominal discomfort x1 day. No fever  Or chills -Emesis was without blood or bile, no diarrhea --In the ED patient is found to meet DKA criteria- patient met DKA criteria on admission with a bicarb of 15, anion gap of 19, serum glucose of 446, beta hydroxybutyric acid of 5.93,VBG with PH is 7.21 -EDP gave IV fluids and started patient on IV insulin -WBC 17.4, UA pending -Last episode of DKA was back in 2019 -Sodium is low at 131, potassium 5.6 -EDP requested hospitalist admission -Additional history obtained from patient's husband at [facility]:    1)DKA--patient met DKA criteria on admission with a bicarb of 14, anion gap of 19, serum glucose of 446, beta hydroxybutyric acid of 5.93,VBG with PH is 7.21 --Last episode of DKA was back in 2019 -Patient was treated with aggressive IV fluids and IV insulin as  per Endo tool protocol -DKA pathophysiology resolved Beta hydroxybutyric acid is down to 0.19 from 5.93 -Bicarb is up to 24 from a low of 14 -Anion gap is down to 4 from a high of 19   2)PseudoHypoNatremia and Hyperkalemia--- sodium and potassium normalized with treatment of DKA as above #1   3)Depression-- c/n Lexapro,   4)HTN---- c/n Labetalol 100 mg qhs   5) leukocytosis--- WBC 17.4 suspect reactive due to DKA, no evidence of acute infection at this time -Patient remains afebrile   Disposition--Home   Dispo: The patient is from: Home              Anticipated d/c is to: Home               Discharge Condition: stable  Follow UP   Follow-up Information     Cassandria Anger, MD. Schedule an appointment as soon as possible for a  visit in 1 week(s).   Specialty: Endocrinology Why: Diabetes management Contact information: [place] [address] [phone]                 Diet and Activity recommendation:  As advised  Discharge Instructions    Discharge Instructions     Call MD for:  difficulty breathing, headache or visual disturbances   Complete by: As directed    Call MD for:  persistant dizziness or light-headedness   Complete by: As directed    Call MD for:  persistant nausea and vomiting   Complete by: As directed    Call MD for:  temperature >100.4   Complete by: As directed    Diet Carb Modified   Complete by: As directed    Discharge instructions   Complete by: As directed    1)Avoid ibuprofen/Advil/Aleve/Motrin/Goody Powders/Naproxen/BC powders/Meloxicam/Diclofenac/Indomethacin and other Nonsteroidal anti-inflammatory medications as these will make you more likely to bleed and can cause stomach ulcers, can also cause Kidney problems.   2)Drink Plenty fluids and check your blood sugars at least 4 times a day   3)follow up with  Endocrinologist Dr. Loni Beckwith, MD, Endocrinology, Diabetes & Metabolism-- [facility], [address] [place], [place] [address], Phone Number- [phone]  4)Avoidance of Tobacco strongly advised   Increase activity slowly   Complete by: As directed          Discharge Medications     Allergies as of 11/06/2020   No Known Allergies      Medication List     STOP taking these medications    ibuprofen 800 MG tablet Commonly known as: Advil       TAKE these medications    acetaminophen 325 MG tablet Commonly known as: TYLENOL Take 2 tablets (650 mg total) by mouth every 6 (six) hours as needed for mild pain (or Fever >/= 101).   escitalopram 10 MG tablet Commonly known as: LEXAPRO Take 10 mg by mouth at bedtime.   fluticasone 50 MCG/ACT nasal spray Commonly known as: FLONASE Place 2 sprays into both nostrils 2  (two) times daily as needed (allergies.).   insulin pump Soln Continue home dose [place] insulin with basal rate totalling 36 units/24 hours   labetalol 100 MG tablet Commonly known as: NORMODYNE Take 50 mg by mouth at bedtime.   nicotine 14 mg/24hr patch Commonly known as: NICODERM CQ - dosed in mg/24 hours Place 1 patch (14 mg total) onto the skin daily. Start taking on: November 07, 2020   NovoLOG 100 UNIT/ML injection Generic  drug: insulin aspart Inject 100-135 Units into the skin daily.   omeprazole 20 MG capsule Commonly known as: PRILOSEC Take 20 mg by mouth daily.   prenatal multivitamin Tabs tablet Take 1 tablet by mouth daily.        Major procedures and Radiology Reports - PLEASE review detailed and final reports for all details, in brief -   No results found.  Micro Results   Recent Results (from the past 240 hour(s))  Resp Panel by RT-PCR (Flu A&B, Covid) Nasopharyngeal Swab     Status: None   Collection Time: 11/05/20  1:24 PM   Specimen: Nasopharyngeal Swab; Nasopharyngeal(NP) swabs in vial transport medium  Result Value Ref Range Status   SARS Coronavirus 2 by RT PCR NEGATIVE NEGATIVE Final    Comment: (NOTE) SARS-CoV-2 target nucleic acids are NOT DETECTED.  The SARS-CoV-2 RNA is generally detectable in upper respiratory specimens during the acute phase of infection. The lowest concentration of SARS-CoV-2 viral copies this assay can detect is 138 copies/mL. A negative result does not preclude SARS-Cov-2 infection and should not be used as the sole basis for treatment or other patient management decisions. A negative result may occur with  improper specimen collection/handling, submission of specimen other than nasopharyngeal swab, presence of viral mutation(s) within the areas targeted by this assay, and inadequate number of viral copies(<138 copies/mL). A negative result must be combined with clinical observations, patient history, and  epidemiological information. The expected result is Negative.  Fact Sheet for Patients:  EntrepreneurPulse.com.au  Fact Sheet for Healthcare Providers:  IncredibleEmployment.be  This test is no t yet approved or cleared by the [place] FDA and  has been authorized for detection and/or diagnosis of SARS-CoV-2 by FDA under an Emergency Use Authorization (EUA). This EUA will remain  in effect (meaning this test can be used) for the duration of the COVID-19 declaration under Section 564(b)(1) of the Act, 21 U.S.C.section 360bbb-3(b)(1), unless the authorization is terminated  or revoked sooner.       Influenza A by PCR NEGATIVE NEGATIVE Final   Influenza B by PCR NEGATIVE NEGATIVE Final    Comment: (NOTE) The Xpert Xpress SARS-CoV-2/FLU/RSV plus assay is intended as an aid in the diagnosis of influenza from Nasopharyngeal swab specimens and should not be used as a sole basis for treatment. Nasal washings and aspirates are unacceptable for Xpert Xpress SARS-CoV-2/FLU/RSV testing.  Fact Sheet for Patients: EntrepreneurPulse.com.au  Fact Sheet for Healthcare Providers: IncredibleEmployment.be  This test is not yet approved or cleared by the [place] FDA and has been authorized for detection and/or diagnosis of SARS-CoV-2 by FDA under an Emergency Use Authorization (EUA). This EUA will remain in effect (meaning this test can be used) for the duration of the COVID-19 declaration under Section 564(b)(1) of the Act, 21 U.S.C. section 360bbb-3(b)(1), unless the authorization is terminated or revoked.  Performed at [facility], [address] [place], [place] [address]   MRSA Next Gen by PCR, Nasal     Status: Abnormal   Collection Time: 11/05/20  5:20 PM   Specimen: Nasal Mucosa; Nasal Swab  Result Value Ref Range Status   MRSA by PCR Next Gen DETECTED (A) NOT DETECTED Final    Comment: CRITICAL  RESULT CALLED TO, READ BACK BY AND VERIFIED WITH: Ralph Dowdy 2139 11/05/2020 COLE[place],R (NOTE) The GeneXpert MRSA Assay (FDA approved for NASAL specimens only), is one component of a comprehensive MRSA colonization surveillance program. It is not intended to diagnose MRSA infection nor to  guide or monitor treatment for MRSA infections. Test performance is not FDA approved in patients less than 46 years old. Performed at [facility], [address], [place], [place] [address]        Today   Subjective    Jennet Scroggin today has no complaints No fever  Or chills   No Nausea, Vomiting or Diarrhea --- Had BMs, tolerating oral intake well         Patient has been seen and examined prior to discharge   Objective   Blood pressure (!) 104/58, pulse 98, temperature 97.9 F (36.6 C), temperature source Oral, resp. rate 13, height $RemoveBe'5\' 6"'LgeNVIcjm$  (1.676 m), weight 76.2 kg, last menstrual period 10/25/2020, SpO2 98 %, unknown if currently breastfeeding.   Intake/Output Summary (Last 24 hours) at 11/06/2020 0956 Last data filed at 11/06/2020 0414 Gross per 24 hour  Intake 4362.22 ml  Output 700 ml  Net 3662.22 ml   Exam Gen:- Awake Alert, no acute distress  HEENT:- [place].AT, No sclera icterus Neck-Supple Neck,No JVD,.  Lungs-  CTAB , good air movement bilaterally  CV- S1, S2 normal, regular Abd-  +ve B.Sounds, Abd Soft, No tenderness,    Extremity/Skin:- No  edema,   good pulses Psych-affect is appropriate, oriented x3 Neuro-no new focal deficits, no tremors    Data Review   CBC w Diff:  Lab Results  Component Value Date   WBC 16.2 (H) 11/06/2020   HGB 12.6 11/06/2020   HCT 36.9 11/06/2020   PLT 307 11/06/2020   LYMPHOPCT 9 11/05/2020   MONOPCT 3 11/05/2020   EOSPCT 0 11/05/2020   BASOPCT 1 11/05/2020    CMP:  Lab Results  Component Value Date   NA 135 11/06/2020   K 3.7 11/06/2020   CL 107 11/06/2020   CO2 24 11/06/2020   BUN 18 11/06/2020   CREATININE 0.60 11/06/2020    PROT 5.9 (L) 11/06/2020   ALBUMIN 3.2 (L) 11/06/2020   BILITOT 0.8 11/06/2020   ALKPHOS 53 11/06/2020   AST 18 11/06/2020   ALT 17 11/06/2020  .   Total Discharge time is about 33 minutes  Roxan Hockey M.D on 11/06/2020 at 9:56 AM  Go to [email or web address] -  for contact info  [facility] - Office  [phone]

## 2020-12-13 ENCOUNTER — Encounter (INDEPENDENT_AMBULATORY_CARE_PROVIDER_SITE_OTHER): Payer: BC Managed Care – PPO | Admitting: Ophthalmology

## 2023-02-20 ENCOUNTER — Ambulatory Visit: Payer: Self-pay | Admitting: Allergy & Immunology

## 2023-03-13 NOTE — Progress Notes (Unsigned)
New Patient Note  RE: RAYME LOPRESTO MRN: [id number] DOB: 1986/03/13 Date of Office Visit: 03/14/2023  Consult requested by: Vivianne Master, PA Primary care provider: Benita Stabile, MD  Chief Complaint: Sinus Problem (6 sinus infections - within 4-5 months - would like to get tested for environmental allergies - takes Claritin and Flonase )  History of Present Illness: I had the pleasure of seeing Treya Alter for initial evaluation at the [facility] on 03/14/2023. She is a 37 y.o. female, who is referred here by Scot Jun, PA (ENT) for the evaluation of chronic sinusitis.  Discussed the use of AI scribe software for clinical note transcription with the patient, who gave verbal consent to proceed.  The patient presents with a history of recurrent sinus infections and allergies. Over the past year, she experienced frequent sinus infections, approximately one every month or every other month, which required antibiotic treatment. The sinus infections were associated with chest and head congestion, coughing, and headaches. Despite these symptoms, a recent ENT evaluation, including a CT scan and endoscopy, showed no obstructions or abnormalities.  The patient's allergies, which began in her early twenties, have worsened over the past few years. She experiences year-round symptoms, with spring and fall being particularly severe. Symptoms include sneezing, dry itchy eyes, internal ear itching, and head and chest congestion. The patient lives in an older single-wide trailer with one dog and two cats. She has had the dog since 2010 and the cats since 2019. She reports that her symptoms seem to worsen when at home.  The patient has tried various medications for her symptoms, including Flonase, Claritin, and Zyrtec, which provide some relief.  She also has a history of diabetes and high blood pressure. She also takes Omeprazole as needed for heartburn or reflux. She has no  history of asthma, food allergies, medication allergies, bee sting allergies, hives, or eczema. She has had allergy testing done years ago, which showed allergies to cats and dogs, and seasonal allergies to pollen. She has not had any allergy shots.       Anosmia: no. Headache: sometimes.  Sinus infections: 2 this year, 6 past year.  Previous ENT evaluation: yes. Previous sinus imaging: yes. Last eye exam: once per month. History of reflux: takes omeprazole prn.  12/31/2022 ENT visit "Impression & Plans:   1) recurrent sinusitis 2) allergic rhinitis  Continue current medications Will refer for allergy evaluation Will plan to recheck here in 3 months "  12/31/2022 CT sinus: "IMPRESSION:  Mild bilateral inferior maxillary sinus mucosal thickening.  Remaining sinuses are clear. "  Assessment and Plan: Kyrie is a 37 y.o. female with: Other allergic rhinitis Recurrent sinusitis History of multiple sinus infections last year and two this year, despite clear sinus CT and ENT evaluation. Symptoms include sneezing, dry itchy eyes, internal ear itching, congestion, and coughing. Symptoms are year-round but worse in spring and fall. 1 dog, 2 cats at home. 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  Return for allergy skin testing. Will make additional recommendations based on results. If significant positives will recommend allergy injections - handout given. Keep track of infections and antibiotics use. If persistent will get bloodwork next to look at immune system.   Return for Skin testing.  No orders of the defined types were placed in this encounter.  Lab Orders  No laboratory test(s) ordered today    Other allergy screening: Asthma: no Food allergy: no Medication allergy: no Hymenoptera allergy: no Urticaria: no Eczema:no  History of recurrent infections suggestive of immunodeficency:  only sinus infections.  Diagnostics: None.   Past Medical History: Patient Active Problem List    Diagnosis Date Noted   DKA (diabetic ketoacidosis) (HCC) 11/05/2020   Hypokalemia 11/05/2020   Missed abortion 05/21/2018   Uncontrolled type 1 diabetes mellitus with hyperglycemia (HCC) 02/02/2018   Tobacco abuse 02/01/2018   Hyperbilirubinemia    SVD (spontaneous vaginal delivery) 10/08/2014   Pre-eclampsia 10/07/2014   DKA (diabetic ketoacidosis) (HCC) 06/23/2013   Hyperkalemia 06/23/2013   Hyponatremia 06/23/2013   Tachycardia 06/23/2013   Nausea 06/23/2013   Diabetes mellitus type I (HCC)    Depression    Past Medical History:  Diagnosis Date   Depression    meds 3 years ago   Diabetes mellitus type I (HCC)    DKA (diabetic ketoacidoses)    Hx of varicella    Missed abortion 05/21/2018   Recurrent upper respiratory infection (URI)    SVD (spontaneous vaginal delivery) 10/08/2014   Past Surgical History: Past Surgical History:  Procedure Laterality Date   DILATION AND EVACUATION N/A 05/22/2018   Procedure: DILATATION AND EVACUATION;  Surgeon: Sherian Rein, MD;  Location: [facility];  Service: Gynecology;  Laterality: N/A;   WISDOM TOOTH EXTRACTION     Medication List:  Current Outpatient Medications  Medication Sig Dispense Refill   fluticasone (FLONASE) 50 MCG/ACT nasal spray Place 2 sprays into both nostrils 2 (two) times daily as needed (allergies.).      Insulin Human (INSULIN PUMP) SOLN Continue home dose [place] insulin with basal rate totalling 36 units/24 hours 1 each 0   lisinopril (ZESTRIL) 2.5 MG tablet Take 2.5 mg by mouth daily.     loratadine (CLARITIN) 10 MG tablet Take 1 tablet by mouth daily.     NOVOLOG 100 UNIT/ML injection Inject 100-135 Units into the skin daily.     omeprazole (PRILOSEC) 20 MG capsule Take 20 mg by mouth daily.     No current facility-administered medications for this visit.   Allergies: No Known Allergies Social History: Social History   Socioeconomic History   Marital status: Married    Spouse name: Not on file    Number of children: Not on file   Years of education: Not on file   Highest education level: Not on file  Occupational History   Not on file  Tobacco Use   Smoking status: Former    Current packs/day: 0.00    Average packs/day: 0.5 packs/day for 4.0 years (2.0 ttl pk-yrs)    Types: Cigarettes    Start date: 03/07/2010    Quit date: 03/07/2014    Years since quitting: 9.0    Passive exposure: Past   Smokeless tobacco: Never  Vaping Use   Vaping status: Never Used  Substance and Sexual Activity   Alcohol use: No   Drug use: No   Sexual activity: Not on file  Other Topics Concern   Not on file  Social History Narrative   Not on file   Social Determinants of Health   Financial Resource Strain: Medium Risk (05/28/2022)   Received from [facility]   Overall Financial Resource Strain (CARDIA)    Difficulty of Paying Living Expenses: Somewhat hard  Food Insecurity: No Food Insecurity (05/28/2022)   Received from [facility]   Hunger Vital Sign    Worried About Running Out of Food in the Last Year: Never true    Ran Out of Food in the Last Year: Never true  Transportation  Needs: No Transportation Needs (05/28/2022)   Received from [facility] - Transportation    Lack of Transportation (Medical): No    Lack of Transportation (Non-Medical): No  Physical Activity: Unknown (05/28/2022)   Received from [facility]   Exercise Vital Sign    Days of Exercise per Week: 0 days    Minutes of Exercise per Session: Not on file  Stress: Stress Concern Present (05/28/2022)   Received from [facility] - Occupational Stress Questionnaire    Feeling of Stress : To some extent  Social Connections: Socially Integrated (05/28/2022)   Received from [facility]   Social Network    How would you rate your social network (family, work, friends)?: Good participation with social networks   Lives in a trailer. Smoking: denies Occupation:  Adult nurse HistorySurveyor, minerals in the house: yes Carpet in the family room: yes Carpet in the bedroom: yes Heating: electric Cooling: central Pet: yes 1 dog x 1+ yrs, 2 cats x   Family History: Family History  Problem Relation Age of Onset   Allergic rhinitis Mother    Cancer Mother    Hypertension Mother    Allergic rhinitis Father    Diabetes Father    Hypertension Father    Allergic rhinitis Brother    Review of Systems  Constitutional:  Negative for appetite change, chills, fever and unexpected weight change.  HENT:  Positive for congestion and sneezing. Negative for postnasal drip and rhinorrhea.   Eyes:  Positive for itching.  Respiratory:  Negative for cough, chest tightness, shortness of breath and wheezing.   Cardiovascular:  Negative for chest pain.  Gastrointestinal:  Negative for abdominal pain.  Genitourinary:  Negative for difficulty urinating.  Skin:  Negative for rash.  Neurological:  Positive for headaches.    Objective: BP 124/82   Pulse 100   Temp 99.1 F (37.3 C) Comment: WRIST  Resp 18   Ht 5\' 6"  (1.676 m)   Wt 176 lb (79.8 kg)   SpO2 98%   BMI 28.41 kg/m  Body mass index is 28.41 kg/m. Physical Exam Vitals and nursing note reviewed.  Constitutional:      Appearance: Normal appearance. She is well-developed.  HENT:     Head: Normocephalic and atraumatic.     Right Ear: Tympanic membrane and external ear normal.     Left Ear: Tympanic membrane and external ear normal.     Nose: Nose normal.     Mouth/Throat:     Mouth: Mucous membranes are moist.     Pharynx: Oropharynx is clear.  Eyes:     Conjunctiva/sclera: Conjunctivae normal.  Cardiovascular:     Rate and Rhythm: Normal rate and regular rhythm.     Heart sounds: Normal heart sounds. No murmur heard.    No friction rub. No gallop.  Pulmonary:     Effort: Pulmonary effort is normal.     Breath sounds: Normal breath sounds. No wheezing, rhonchi or rales.   Musculoskeletal:     Cervical back: Neck supple.  Skin:    General: Skin is warm.     Findings: No rash.  Neurological:     Mental Status: She is alert and oriented to person, place, and time.  Psychiatric:        Behavior: Behavior normal.   The plan was reviewed with the patient/family, and all questions/concerned were addressed.  It was my pleasure to see [place] today  and participate in her care. Please feel free to contact me with any questions or concerns.  Sincerely,  Wyline Mood, DO Allergy & Immunology  [facility] office: [phone] [facility] office: [phone]

## 2023-03-14 ENCOUNTER — Ambulatory Visit: Payer: BC Managed Care – PPO | Admitting: Allergy

## 2023-03-14 ENCOUNTER — Encounter: Payer: Self-pay | Admitting: Allergy

## 2023-03-14 ENCOUNTER — Other Ambulatory Visit: Payer: Self-pay

## 2023-03-14 VITALS — BP 124/82 | HR 100 | Temp 99.1°F | Resp 18 | Ht 66.0 in | Wt 176.0 lb

## 2023-03-14 DIAGNOSIS — J329 Chronic sinusitis, unspecified: Secondary | ICD-10-CM | POA: Diagnosis not present

## 2023-03-14 DIAGNOSIS — J3089 Other allergic rhinitis: Secondary | ICD-10-CM

## 2023-03-14 NOTE — Patient Instructions (Addendum)
Rhinitis  Return for allergy skin testing. Will make additional recommendations based on results. If significant positives will recommend allergy injections - handout given. Make sure you don't take any antihistamines for 3 days before the skin testing appointment. Don't put any lotion on the back and arms on the day of testing.  Plan on being here for 30-60 minutes.   Infections Keep track of infections and antibiotics use. If persistent will get bloodwork next to look at immune system.

## 2023-03-20 ENCOUNTER — Ambulatory Visit: Payer: BC Managed Care – PPO | Admitting: Allergy & Immunology

## 2023-03-29 ENCOUNTER — Ambulatory Visit: Payer: Self-pay | Admitting: Allergy & Immunology

## 2023-04-26 ENCOUNTER — Encounter: Payer: Self-pay | Admitting: Allergy & Immunology

## 2023-04-26 ENCOUNTER — Ambulatory Visit: Payer: 59 | Admitting: Allergy & Immunology

## 2023-04-26 DIAGNOSIS — J329 Chronic sinusitis, unspecified: Secondary | ICD-10-CM

## 2023-04-26 DIAGNOSIS — J302 Other seasonal allergic rhinitis: Secondary | ICD-10-CM | POA: Diagnosis not present

## 2023-04-26 DIAGNOSIS — J3089 Other allergic rhinitis: Secondary | ICD-10-CM

## 2023-04-26 MED ORDER — RYALTRIS 665-25 MCG/ACT NA SUSP: 2.0000 | Freq: Two times a day (BID) | NASAL | 5 refills | Status: DC | PRN

## 2023-04-26 MED ORDER — MONTELUKAST SODIUM 10 MG PO TABS: 10.0000 mg | ORAL_TABLET | Freq: Every day | ORAL | 1 refills | Status: DC

## 2023-04-26 MED ORDER — RYALTRIS 665-25 MCG/ACT NA SUSP: 2.0000 | Freq: Two times a day (BID) | NASAL | 5 refills | Status: AC | PRN

## 2023-04-26 MED ORDER — LEVOCETIRIZINE DIHYDROCHLORIDE 5 MG PO TABS: 5.0000 mg | ORAL_TABLET | Freq: Two times a day (BID) | ORAL | 1 refills | Status: DC

## 2023-04-26 NOTE — Patient Instructions (Addendum)
 1. Seasonal and perennial allergic rhinitis (Primary) - Testing today showed: grasses, ragweed, weeds, trees, indoor molds, outdoor molds, dust mites, cat, cockroach, and horse - Copy of test results provided.  - Avoidance measures provided. - Continue with: Xyzal  (levocetirizine) 5mg  tablet TWICE daily (NOTE NEW DOSE) - Start taking: Singulair  (montelukast ) 10mg  daily and Ryaltris  (olopatadine/mometasone) two sprays per nostril 1-2 times daily as needed - You can use an extra dose of the antihistamine, if needed, for breakthrough symptoms.  - Consider nasal saline rinses 1-2 times daily to remove allergens from the nasal cavities as well as help with mucous clearance (this is especially helpful to do before the nasal sprays are given) - Consider allergy  shots as a means of long-term control. - Allergy  shots re-train and reset the immune system to ignore environmental allergens and decrease the resulting immune response to those allergens (sneezing, itchy watery eyes, runny nose, nasal congestion, etc).    - Allergy  shots improve symptoms in 75-85% of patients.  - We can discuss more at the next appointment if the medications are not working for you. - CPT codes provided in case you change your mind.  2. Recurrent sinusitis - We can do an immune workup in the future if needed. - But let's see if controlling allergic rhinitis more effectively will help first.  3. Return in about 2 months (around 06/24/2023). You can have the follow up appointment with Dr. Iva or a Nurse Practicioner (our Nurse Practitioners are excellent and always have Physician oversight!).    Please inform us  of any Emergency Department visits, hospitalizations, or changes in symptoms. Call us  before going to the ED for breathing or allergy  symptoms since we might be able to fit you in for a sick visit. Feel free to contact us  anytime with any questions, problems, or concerns.  It was a pleasure to meet you  today!  Websites that have reliable patient information: 1. American Academy of Asthma, Allergy , and Immunology: www.aaaai.org 2. Food Allergy  Research and Education (FARE): foodallergy.org 3. Mothers of Asthmatics: http://www.asthmacommunitynetwork.org 4. American College of Allergy , Asthma, and Immunology: www.acaai.org      "Like" us  on Facebook and Instagram for our latest updates!      A healthy democracy works best when [employer] participate! Make sure you are registered to vote! If you have moved or changed any of your contact information, you will need to get this updated before voting! Scan the QR codes below to learn more!         Airborne Adult Perc - 04/26/23 0830     Time Antigen Placed 0830    Allergen Manufacturer Jestine    Location Back    Number of Test 55    1. Control-Buffer 50% Glycerol Negative    2. Control-Histamine 2+    3. Bahia Negative    4. Bermuda Negative    5. Johnson Negative    6. [place]  Blue Negative    7. Meadow Fescue Negative    8. Perennial Rye 2+    9. Timothy 2+    10. Ragweed Mix Negative    11. Cocklebur 2+    12. Plantain,  English Negative    13. Baccharis Negative    14. Dog Fennel Negative    15. Russian Thistle Negative    16. Lamb's Quarters Negative    17. Sheep Sorrell Negative    18. Rough Pigweed Negative    19. Marsh Elder, Rough 2+    20. Mugwort, Common  2+    21. Box, Elder Negative    22. Cedar, red Negative    23. Sweet Gum Negative    24. Pecan Pollen Negative    25. Pine Mix Negative    26. Walnut, Black Pollen Negative    27. Red Mulberry 2+    28. Ash Mix 2+    29. Birch Mix 3+    30. Beech American Negative    31. Cottonwood, Eastern 2+    32. Hickory, White 2+    33. Maple Mix Negative    34. Oak, Eastern Mix Negative    35. Sycamore Eastern 2+    36. Alternaria Alternata 2+    37. Cladosporium Herbarum 2+    38. Aspergillus Mix 2+    39. Penicillium Mix Negative    40. Bipolaris  Sorokiniana (Helminthosporium) 2+    41. Drechslera Spicifera (Curvularia) 2+    42. Mucor Plumbeus Negative    43. Fusarium Moniliforme 3+    44. Aureobasidium Pullulans (pullulara) Negative    45. Rhizopus Oryzae Negative    46. Botrytis Cinera 2+    47. Epicoccum Nigrum Negative    48. Phoma Betae Negative    49. Dust Mite Mix 3+    50. Cat Hair 10,000 BAU/ml 3+    51.  Dog Epithelia Negative    52. Mixed Feathers Negative    53. Horse Epithelia 3+    54. Cockroach, German 2+    55. Tobacco Leaf Negative             Intradermal - 04/26/23 0902     Time Antigen Placed 9094    Allergen Manufacturer Greer    Location Arm    Number of Test 6    Control Negative    Bahia Negative    Bermuda Negative    Johnson Negative    Ragweed Mix 4+    Dog Negative             Reducing Pollen Exposure  The American Academy of Allergy , Asthma and Immunology suggests the following steps to reduce your exposure to pollen during allergy  seasons.    Do not hang sheets or clothing out to dry; pollen may collect on these items. Do not mow lawns or spend time around freshly cut grass; mowing stirs up pollen. Keep windows closed at night.  Keep car windows closed while driving. Minimize morning activities outdoors, a time when pollen counts are usually at their highest. Stay indoors as much as possible when pollen counts or humidity is high and on windy days when pollen tends to remain in the air longer. Use air conditioning when possible.  Many air conditioners have filters that trap the pollen spores. Use a HEPA room air filter to remove pollen form the indoor air you breathe.  Control of Mold Allergen   Mold and fungi can grow on a variety of surfaces provided certain temperature and moisture conditions exist.  Outdoor molds grow on plants, decaying vegetation and soil.  The major outdoor mold, Alternaria and Cladosporium, are found in very high numbers during hot and dry  conditions.  Generally, a late Summer - Fall peak is seen for common outdoor fungal spores.  Rain will temporarily lower outdoor mold spore count, but counts rise rapidly when the rainy period ends.  The most important indoor molds are Aspergillus and Penicillium.  Dark, humid and poorly ventilated basements are ideal sites for mold growth.  The next most common sites of mold  growth are the bathroom and the kitchen.  Outdoor (Seasonal) Mold Control  Positive outdoor molds via skin testing: Alternaria, Cladosporium, Bipolaris (Helminthsporium), and Drechslera (Curvalaria)  Use air conditioning and keep windows closed Avoid exposure to decaying vegetation. Avoid leaf raking. Avoid grain handling. Consider wearing a face mask if working in moldy areas.    Indoor (Perennial) Mold Control   Positive indoor molds via skin testing: Aspergillus, Fusarium, and Botrytis  Maintain humidity below 50%. Clean washable surfaces with 5% bleach solution. Remove sources e.g. contaminated carpets.    Control of Dog or Cat Allergen  Avoidance is the best way to manage a dog or cat allergy . If you have a dog or cat and are allergic to dog or cats, consider removing the dog or cat from the home. If you have a dog or cat but don't want to find it a new home, or if your family wants a pet even though someone in the household is allergic, here are some strategies that may help keep symptoms at bay:  Keep the pet out of your bedroom and restrict it to only a few rooms. Be advised that keeping the dog or cat in only one room will not limit the allergens to that room. Don't pet, hug or kiss the dog or cat; if you do, wash your hands with soap and water. High-efficiency particulate air (HEPA) cleaners run continuously in a bedroom or living room can reduce allergen levels over time. Regular use of a high-efficiency vacuum cleaner or a central vacuum can reduce allergen levels. Giving your dog or cat a bath at  least once a week can reduce airborne allergen.  Control of Dust Mite Allergen    Dust mites play a major role in allergic asthma and rhinitis.  They occur in environments with high humidity wherever human skin is found.  Dust mites absorb humidity from the atmosphere (ie, they do not drink) and feed on organic matter (including shed human and animal skin).  Dust mites are a microscopic type of insect that you cannot see with the naked eye.  High levels of dust mites have been detected from mattresses, pillows, carpets, upholstered furniture, bed covers, clothes, soft toys and any woven material.  The principal allergen of the dust mite is found in its feces.  A gram of dust may contain 1,000 mites and 250,000 fecal particles.  Mite antigen is easily measured in the air during house cleaning activities.  Dust mites do not bite and do not cause harm to humans, other than by triggering allergies/asthma.    Ways to decrease your exposure to dust mites in your home:  Encase mattresses, box springs and pillows with a mite-impermeable barrier or cover   Wash sheets, blankets and drapes weekly in hot water (130 F) with detergent and dry them in a dryer on the hot setting.  Have the room cleaned frequently with a vacuum cleaner and a damp dust-mop.  For carpeting or rugs, vacuuming with a vacuum cleaner equipped with a high-efficiency particulate air (HEPA) filter.  The dust mite allergic individual should not be in a room which is being cleaned and should wait 1 hour after cleaning before going into the room. Do not sleep on upholstered furniture (eg, couches).   If possible removing carpeting, upholstered furniture and drapery from the home is ideal.  Horizontal blinds should be eliminated in the rooms where the person spends the most time (bedroom, study, television room).  Washable vinyl, roller-type shades are  optimal. Remove all non-washable stuffed toys from the bedroom.  Wash stuffed toys weekly like  sheets and blankets above.   Reduce indoor humidity to less than 50%.  Inexpensive humidity monitors can be purchased at most hardware stores.  Do not use a humidifier as can make the problem worse and are not recommended.  Control of Cockroach Allergen  Cockroach allergen has been identified as an important cause of acute attacks of asthma, especially in urban settings.  There are fifty-five species of cockroach that exist in the [place] , however only three, the American, German and Oriental species produce allergen that can affect patients with Asthma.  Allergens can be obtained from fecal particles, egg casings and secretions from cockroaches.    Remove food sources. Reduce access to water. Seal access and entry points. Spray runways with 0.5-1% Diazinon or Chlorpyrifos Blow boric acid power under stoves and refrigerator. Place bait stations (hydramethylnon) at feeding sites.  Allergy  Shots  Allergies are the result of a chain reaction that starts in the immune system. Your immune system controls how your body defends itself. For instance, if you have an allergy  to pollen, your immune system identifies pollen as an invader or allergen. Your immune system overreacts by producing antibodies called Immunoglobulin E (IgE). These antibodies travel to cells that release chemicals, causing an allergic reaction.  The concept behind allergy  immunotherapy, whether it is received in the form of shots or tablets, is that the immune system can be desensitized to specific allergens that trigger allergy  symptoms. Although it requires time and patience, the payback can be long-term relief. Allergy  injections contain a dilute solution of those substances that you are allergic to based upon your skin testing and allergy  history.   How Do Allergy  Shots Work?  Allergy  shots work much like a vaccine. Your body responds to injected amounts of a particular allergen given in increasing doses, eventually  developing a resistance and tolerance to it. Allergy  shots can lead to decreased, minimal or no allergy  symptoms.  There generally are two phases: build-up and maintenance. Build-up often ranges from three to six months and involves receiving injections with increasing amounts of the allergens. The shots are typically given once or twice a week, though more rapid build-up schedules are sometimes used.  The maintenance phase begins when the most effective dose is reached. This dose is different for each person, depending on how allergic you are and your response to the build-up injections. Once the maintenance dose is reached, there are longer periods between injections, typically two to four weeks.  Occasionally doctors give cortisone-type shots that can temporarily reduce allergy  symptoms. These types of shots are different and should not be confused with allergy  immunotherapy shots.  Who Can Be Treated with Allergy  Shots?  Allergy  shots may be a good treatment approach for people with allergic rhinitis (hay fever), allergic asthma, conjunctivitis (eye allergy ) or stinging insect allergy .   Before deciding to begin allergy  shots, you should consider:   The length of allergy  season and the severity of your symptoms  Whether medications and/or changes to your environment can control your symptoms  Your desire to avoid long-term medication use  Time: allergy  immunotherapy requires a major time commitment  Cost: may vary depending on your insurance coverage  Allergy  shots for children age 18 and older are effective and often well tolerated. They might prevent the onset of new allergen sensitivities or the progression to asthma.  Allergy  shots are not started on patients who are  pregnant but can be continued on patients who become pregnant while receiving them. In some patients with other medical conditions or who take certain common medications, allergy  shots may be of risk. It is important to  mention other medications you talk to your allergist.   What are the two types of build-ups offered:   RUSH or Rapid Desensitization -- one day of injections lasting from 8:30-4:30pm, injections every 1 hour.  Approximately half of the build-up process is completed in that one day.  The following week, normal build-up is resumed, and this entails ~16 visits either weekly or twice weekly, until reaching your "maintenance dose" which is continued weekly until eventually getting spaced out to every month for a duration of 3 to 5 years. The regular build-up appointments are nurse visits where the injections are administered, followed by required monitoring for 30 minutes.    Traditional build-up -- weekly visits for 6 -12 months until reaching "maintenance dose", then continue weekly until eventually spacing out to every 4 weeks as above. At these appointments, the injections are administered, followed by required monitoring for 30 minutes.     Either way is acceptable, and both are equally effective. With the rush protocol, the advantage is that less time is spent here for injections overall AND you would also reach maintenance dosing faster (which is when the clinical benefit starts to become more apparent). Not everyone is a candidate for rapid desensitization.   IF we proceed with the RUSH protocol, there are premedications which must be taken the day before and the day after the rush only (this includes antihistamines, steroids, and Singulair ).  After the rush day, no prednisone or Singulair  is required, and we just recommend antihistamines taken on your injection day.  What Is An Estimate of the Costs?  If you are interested in starting allergy  injections, please check with your insurance company about your coverage for both allergy  vial sets and allergy  injections.  Please do so prior to making the appointment to start injections.  The following are CPT codes to give to your insurance company.  These are the amounts we BILL to the insurance company, but the amount YOU WILL PAY and WE RECEIVE IS SUBSTANTIALLY LESS and depends on the contracts we have with different insurance companies.   Amount Billed to Insurance Two allergy  vial set  CPT 95165   $ 2400      Two injections   CPT 95117   $ 40  RUSH (Rapid Desensitization) CPT 95180 x 8 hours  $500/hour  Regarding the allergy  injections, your co-pay may or may not apply with each injection, so please confirm this with your insurance company. When you start allergy  injections, 1 or 2 sets of vials are made based on your allergies.  Not all patients can be on one set of vials. A set of vials lasts 6 months to a year depending on how quickly you can proceed with your build-up of your allergy  injections. Vials are personalized for each patient depending on their specific allergens.  How often are allergy  injection given during the build-up period?   Injections are given at least weekly during the build-up period until your maintenance dose is achieved. Per the doctor's discretion, you may have the option of getting allergy  injections two times per week during the build-up period. However, there must be at least 48 hours between injections. The build-up period is usually completed within 6-12 months depending on your ability to schedule injections and for adjustments  for reactions. When maintenance dose is reached, your injection schedule is gradually changed to every two weeks and later to every three weeks. Injections will then continue every 4 weeks. Usually, injections are continued for a total of 3-5 years.   When Will I Feel Better?  Some may experience decreased allergy  symptoms during the build-up phase. For others, it may take as long as 12 months on the maintenance dose. If there is no improvement after a year of maintenance, your allergist will discuss other treatment options with you.  If you aren't responding to allergy  shots, it  may be because there is not enough dose of the allergen in your vaccine or there are missing allergens that were not identified during your allergy  testing. Other reasons could be that there are high levels of the allergen in your environment or major exposure to non-allergic triggers like tobacco smoke.  What Is the Length of Treatment?  Once the maintenance dose is reached, allergy  shots are generally continued for three to five years. The decision to stop should be discussed with your allergist at that time. Some people may experience a permanent reduction of allergy  symptoms. Others may relapse and a longer course of allergy  shots can be considered.  What Are the Possible Reactions?  The two types of adverse reactions that can occur with allergy  shots are local and systemic. Common local reactions include very mild redness and swelling at the injection site, which can happen immediately or several hours after. Report a delayed reaction from your last injection. These include arm swelling or runny nose, watery eyes or cough that occurs within 12-24 hours after injection. A systemic reaction, which is less common, affects the entire body or a particular body system. They are usually mild and typically respond quickly to medications. Signs include increased allergy  symptoms such as sneezing, a stuffy nose or hives.   Rarely, a serious systemic reaction called anaphylaxis can develop. Symptoms include swelling in the throat, wheezing, a feeling of tightness in the chest, nausea or dizziness. Most serious systemic reactions develop within 30 minutes of allergy  shots. This is why it is strongly recommended you wait in your doctor's office for 30 minutes after your injections. Your allergist is trained to watch for reactions, and his or her staff is trained and equipped with the proper medications to identify and treat them.   Report to the nurse immediately if you experience any of the following symptoms:  swelling, itching or redness of the skin, hives, watery eyes/nose, breathing difficulty, excessive sneezing, coughing, stomach pain, diarrhea, or light headedness. These symptoms may occur within 15-20 minutes after injection and may require medication.   Who Should Administer Allergy  Shots?  The preferred location for receiving shots is your prescribing allergist's office. Injections can sometimes be given at another facility where the physician and staff are trained to recognize and treat reactions, and have received instructions by your prescribing allergist.  What if I am late for an injection?   Injection dose will be adjusted depending upon how many days or weeks you are late for your injection.   What if I am sick?   Please report any illness to the nurse before receiving injections. She may adjust your dose or postpone injections depending on your symptoms. If you have fever, flu, sinus infection or chest congestion it is best to postpone allergy  injections until you are better. Never get an allergy  injection if your asthma is causing you problems. If your symptoms persist, seek  out medical care to get your health problem under control.  What If I am or Become Pregnant:  Women that become pregnant should schedule an appointment with [facility] before receiving any further allergy  injections.

## 2023-04-26 NOTE — Progress Notes (Signed)
 FOLLOW UP  Date of Service/Encounter:  04/26/23   Assessment:   Seasonal and perennial allergic rhinitis (grasses, ragweed, weeds, trees, indoor molds, outdoor molds, dust mites, cat, cockroach, and horse)  Recurrent sinusitis  GERD  Type 1 diabetes mellitus  Plan/Recommendations:   1. Seasonal and perennial allergic rhinitis (Primary) - Testing today showed: grasses, ragweed, weeds, trees, indoor molds, outdoor molds, dust mites, cat, cockroach, and horse - Copy of test results provided.  - Avoidance measures provided. - Continue with: Xyzal  (levocetirizine) 5mg  tablet TWICE daily (NOTE NEW DOSE) - Start taking: Singulair  (montelukast ) 10mg  daily and Ryaltris  (olopatadine/mometasone) two sprays per nostril 1-2 times daily as needed - You can use an extra dose of the antihistamine, if needed, for breakthrough symptoms.  - Consider nasal saline rinses 1-2 times daily to remove allergens from the nasal cavities as well as help with mucous clearance (this is especially helpful to do before the nasal sprays are given) - Consider allergy  shots as a means of long-term control. - Allergy  shots re-train and reset the immune system to ignore environmental allergens and decrease the resulting immune response to those allergens (sneezing, itchy watery eyes, runny nose, nasal congestion, etc).    - Allergy  shots improve symptoms in 75-85% of patients.  - We can discuss more at the next appointment if the medications are not working for you. - CPT codes provided in case you change your mind.  2. Recurrent sinusitis - We can do an immune workup in the future if needed. - But let's see if controlling allergic rhinitis more effectively will help first.  3. Return in about 2 months (around 06/24/2023). You can have the follow up appointment with Dr. Iva or a Nurse Practicioner (our Nurse Practitioners are excellent and always have Physician oversight!).   Subjective:   Norma Allen is a 38 y.o. female presenting today for follow up of No chief complaint on file.   Norma Allen has a history of the following: Patient Active Problem List   Diagnosis Date Noted   DKA (diabetic ketoacidosis) (HCC) 11/05/2020   Hypokalemia 11/05/2020   Missed abortion 05/21/2018   Uncontrolled type 1 diabetes mellitus with hyperglycemia (HCC) 02/02/2018   Tobacco abuse 02/01/2018   Hyperbilirubinemia    SVD (spontaneous vaginal delivery) 10/08/2014   Pre-eclampsia 10/07/2014   DKA (diabetic ketoacidosis) (HCC) 06/23/2013   Hyperkalemia 06/23/2013   Hyponatremia 06/23/2013   Tachycardia 06/23/2013   Nausea 06/23/2013   Diabetes mellitus type I (HCC)    Depression     History obtained from: chart review and patient.  Discussed the use of AI scribe software for clinical note transcription with the patient and/or guardian, who gave verbal consent to proceed.  Norma Allen is a 38 y.o. female presenting for skin testing. She was last seen on November 21st, 2024. We could not do testing because her insurance company does not cover testing on the same day as a New Patient visit. She has been off of all antihistamines 3 days in anticipation of the testing.   Otherwise, there have been no changes to her past medical history, surgical history, family history, or social history.    Review of systems otherwise negative other than that mentioned in the HPI.    Objective:   unknown if currently breastfeeding. There is no height or weight on file to calculate BMI.    Physical exam deferred since this was a skin testing appointment only.   Diagnostic studies:   Allergy  Studies:  Airborne Adult Perc - 04/26/23 0830     Time Antigen Placed 0830    Allergen Manufacturer Jestine    Location Back    Number of Test 55    1. Control-Buffer 50% Glycerol Negative    2. Control-Histamine 2+    3. Bahia Negative    4. Bermuda Negative    5. Johnson Negative    6. [place]  Blue  Negative    7. Meadow Fescue Negative    8. Perennial Rye 2+    9. Timothy 2+    10. Ragweed Mix Negative    11. Cocklebur 2+    12. Plantain,  English Negative    13. Baccharis Negative    14. Dog Fennel Negative    15. Russian Thistle Negative    16. Lamb's Quarters Negative    17. Sheep Sorrell Negative    18. Rough Pigweed Negative    19. Marsh Elder, Rough 2+    20. Mugwort, Common 2+    21. Box, Elder Negative    22. Cedar, red Negative    23. Sweet Gum Negative    24. Pecan Pollen Negative    25. Pine Mix Negative    26. Walnut, Black Pollen Negative    27. Red Mulberry 2+    28. Ash Mix 2+    29. Birch Mix 3+    30. Beech American Negative    31. Cottonwood, Eastern 2+    32. Hickory, White 2+    33. Maple Mix Negative    34. Oak, Eastern Mix Negative    35. Sycamore Eastern 2+    36. Alternaria Alternata 2+    37. Cladosporium Herbarum 2+    38. Aspergillus Mix 2+    39. Penicillium Mix Negative    40. Bipolaris Sorokiniana (Helminthosporium) 2+    41. Drechslera Spicifera (Curvularia) 2+    42. Mucor Plumbeus Negative    43. Fusarium Moniliforme 3+    44. Aureobasidium Pullulans (pullulara) Negative    45. Rhizopus Oryzae Negative    46. Botrytis Cinera 2+    47. Epicoccum Nigrum Negative    48. Phoma Betae Negative    49. Dust Mite Mix 3+    50. Cat Hair 10,000 BAU/ml 3+    51.  Dog Epithelia Negative    52. Mixed Feathers Negative    53. Horse Epithelia 3+    54. Cockroach, German 2+    55. Tobacco Leaf Negative             Intradermal - 04/26/23 0902     Time Antigen Placed 9094    Allergen Manufacturer Jestine    Location Arm    Number of Test 6    Control Negative    Bahia Negative    Bermuda Negative    Johnson Negative    Ragweed Mix 4+    Dog Negative             Allergy  testing results were read and interpreted by myself, documented by clinical staff.      Marty Shaggy, MD  [facility]

## 2023-07-10 NOTE — Patient Instructions (Incomplete)
 Allergic rhinitis Continue allergen avoidance measures directed toward grass pollen, weed pollen, ragweed pollen, tree pollen, indoor mold, outdoor mold, dust mite, cat, cockroach, and horse as listed below Continue montelukast 10 mg once a day to control allergy symptoms Continue Xyzal 5 mg once a day if needed for runny nose or itch Continue Ryaltris 2 sprays in each nostril up to twice a day if needed for nasal symptoms. In the right nostril, point the applicator out toward the right ear. In the left nostril, point the applicator out toward the left ear Consider saline nasal rinses as needed for nasal symptoms. Use this before any medicated nasal sprays for best result Consider nasal saline gel as needed for dry nostrils Consider allergen immunotherapy if your symptoms are not well-controlled with the treatment plan as listed above  Recurrent sinus infection Continue to keep track of infections, antibiotic use, and steroid use  Reflux Continue dietary lifestyle modifications as listed below  Epistaxis Pinch both nostrils while leaning forward for at least 5 minutes before checking to see if the bleeding has stopped. If bleeding is not controlled within 5-10 minutes apply a cotton ball soaked with oxymetazoline (Afrin) to the bleeding nostril for a few seconds.   Call the clinic if this treatment plan is not working well for you  Follow up in 1 year or sooner if needed.  Reducing Pollen Exposure The American Academy of Allergy, Asthma and Immunology suggests the following steps to reduce your exposure to pollen during allergy seasons. Do not hang sheets or clothing out to dry; pollen may collect on these items. Do not mow lawns or spend time around freshly cut grass; mowing stirs up pollen. Keep windows closed at night.  Keep car windows closed while driving. Minimize morning activities outdoors, a time when pollen counts are usually at their highest. Stay indoors as much as possible  when pollen counts or humidity is high and on windy days when pollen tends to remain in the air longer. Use air conditioning when possible.  Many air conditioners have filters that trap the pollen spores. Use a HEPA room air filter to remove pollen form the indoor air you breathe.  Control of Mold Allergen Mold and fungi can grow on a variety of surfaces provided certain temperature and moisture conditions exist.  Outdoor molds grow on plants, decaying vegetation and soil.  The major outdoor mold, Alternaria and Cladosporium, are found in very high numbers during hot and dry conditions.  Generally, a late Summer - Fall peak is seen for common outdoor fungal spores.  Rain will temporarily lower outdoor mold spore count, but counts rise rapidly when the rainy period ends.  The most important indoor molds are Aspergillus and Penicillium.  Dark, humid and poorly ventilated basements are ideal sites for mold growth.  The next most common sites of mold growth are the bathroom and the kitchen.  Outdoor [employer] Use air conditioning and keep windows closed Avoid exposure to decaying vegetation. Avoid leaf raking. Avoid grain handling. Consider wearing a face mask if working in moldy areas.  Indoor Mold Control Maintain humidity below 50%. Clean washable surfaces with 5% bleach solution. Remove sources e.g. Contaminated carpets.   Control of Dust Mite Allergen Dust mites play a major role in allergic asthma and rhinitis. They occur in environments with high humidity wherever human skin is found. Dust mites absorb humidity from the atmosphere (ie, they do not drink) and feed on organic matter (including shed human and animal skin).  Dust mites are a microscopic type of insect that you cannot see with the naked eye. High levels of dust mites have been detected from mattresses, pillows, carpets, upholstered furniture, bed covers, clothes, soft toys and any woven material. The principal allergen of the  dust mite is found in its feces. A gram of dust may contain 1,000 mites and 250,000 fecal particles. Mite antigen is easily measured in the air during house cleaning activities. Dust mites do not bite and do not cause harm to humans, other than by triggering allergies/asthma.  Ways to decrease your exposure to dust mites in your home:  1. Encase mattresses, box springs and pillows with a mite-impermeable barrier or cover  2. Wash sheets, blankets and drapes weekly in hot water (130 F) with detergent and dry them in a dryer on the hot setting.  3. Have the room cleaned frequently with a vacuum cleaner and a damp dust-mop. For carpeting or rugs, vacuuming with a vacuum cleaner equipped with a high-efficiency particulate air (HEPA) filter. The dust mite allergic individual should not be in a room which is being cleaned and should wait 1 hour after cleaning before going into the room.  4. Do not sleep on upholstered furniture (eg, couches).  5. If possible removing carpeting, upholstered furniture and drapery from the home is ideal. Horizontal blinds should be eliminated in the rooms where the person spends the most time (bedroom, study, television room). Washable vinyl, roller-type shades are optimal.  6. Remove all non-washable stuffed toys from the bedroom. Wash stuffed toys weekly like sheets and blankets above.  7. Reduce indoor humidity to less than 50%. Inexpensive humidity monitors can be purchased at most hardware stores. Do not use a humidifier as can make the problem worse and are not recommended.  Control of Dog or Cat Allergen Avoidance is the best way to manage a dog or cat allergy. If you have a dog or cat and are allergic to dog or cats, consider removing the dog or cat from the home. If you have a dog or cat but don't want to find it a new home, or if your family wants a pet even though someone in the household is allergic, here are some strategies that may help keep symptoms at  bay:  Keep the pet out of your bedroom and restrict it to only a few rooms. Be advised that keeping the dog or cat in only one room will not limit the allergens to that room. Don't pet, hug or kiss the dog or cat; if you do, wash your hands with soap and water. High-efficiency particulate air (HEPA) cleaners run continuously in a bedroom or living room can reduce allergen levels over time. Regular use of a high-efficiency vacuum cleaner or a central vacuum can reduce allergen levels. Giving your dog or cat a bath at least once a week can reduce airborne allergen.  Control of Cockroach Allergen Cockroach allergen has been identified as an important cause of acute attacks of asthma, especially in urban settings.  There are fifty-five species of cockroach that exist in the [place], however only three, the [place], [place] species produce allergen that can affect patients with Asthma.  Allergens can be obtained from fecal particles, egg casings and secretions from cockroaches.    Remove food sources. Reduce access to water. Seal access and entry points. Spray runways with 0.5-1% Diazinon or Chlorpyrifos Blow boric acid power under stoves and refrigerator. Place bait stations (hydramethylnon) at feeding sites.

## 2023-07-10 NOTE — Progress Notes (Signed)
 [address] [place] [address] Dept: [phone]  FOLLOW UP NOTE  Patient ID: Norma Allen, female    DOB: [birth year]  Age: 38 y.o. MRN: [id number] Date of Office Visit: 07/12/2023  Assessment  Chief Complaint: Allergic Rhinitis  (Medication is helping )  HPI Norma Allen is a 38 year old female who presents to the clinic for follow-up visit.  She was last seen in this clinic on 04/26/2023 by Dr. Dellis Anes for evaluation of allergic rhinitis, recurrent sinusitis, reflux, and type 1 diabetes.   At today's visit, she reports her allergic rhinitis has been moderately well-controlled with occasional nasal congestion as the main symptom.  She continues Xyzol 5 mg once a day, montelukast 10 mg once a day, Ryaltris, and nasal saline rinses about once or twice a week.  Her last environmental allergy skin testing was on 04/26/2023 was positive to grass pollen, weed pollen, ragweed pollen, tree pollen, indoor mold, outdoor mold, dust mite, cat, cockroach, and horse.  We had a brief discussion regarding allergen immunotherapy.  She is satisfied with medical intervention at this time and will consider allergen immunotherapy if her symptoms become uncontrolled.  She does report occasional epistaxis occurring during pollen season as well as over the last few weeks.  She reports this bleeding is mainly coming from the left nostril.  She reports the nasal bleeding stops in under 5 minutes.  She reports poor application of Ryaltris.  Proper application technique was discussed in detail.  She denies infection, antibiotic use, or steroid use since her last visit to this clinic.  Reflux is reported as well-controlled with no symptoms including heartburn or vomiting.  She is not currently taking a medication to control reflux, however, she has previously taken omeprazole 20 mg with relief of symptoms.  Her current medications are listed in the chart.  Drug Allergies:  No Known  Allergies  Physical Exam: BP 126/80 (BP Location: Right Arm, Patient Position: Sitting, Cuff Size: Normal)   Pulse (!) 117   Temp 98.3 F (36.8 C) (Temporal)   Resp 18   Ht 5\' 6"  (1.676 m)   Wt 173 lb 9.6 oz (78.7 kg)   SpO2 97%   BMI 28.02 kg/m      Assessment and Plan: 1. Seasonal and perennial allergic rhinitis   2. Recurrent sinusitis   3. Gastroesophageal reflux disease, unspecified whether esophagitis present   4. Epistaxis     No orders of the defined types were placed in this encounter.   Patient Instructions  Allergic rhinitis Continue allergen avoidance measures directed toward grass pollen, weed pollen, ragweed pollen, tree pollen, indoor mold, outdoor mold, dust mite, cat, cockroach, and horse as listed below Continue montelukast 10 mg once a day to control allergy symptoms Continue Xyzal 5 mg once a day if needed for runny nose or itch Continue Ryaltris 2 sprays in each nostril up to twice a day if needed for nasal symptoms. In the right nostril, point the applicator out toward the right ear. In the left nostril, point the applicator out toward the left ear Consider saline nasal rinses as needed for nasal symptoms. Use this before any medicated nasal sprays for best result Consider nasal saline gel as needed for dry nostrils Consider allergen immunotherapy if your symptoms are not well-controlled with the treatment plan as listed above  Recurrent sinus infection Continue to keep track of infections, antibiotic use, and steroid use  Reflux Continue dietary lifestyle modifications as listed below  Epistaxis  Pinch both nostrils while leaning forward for at least 5 minutes before checking to see if the bleeding has stopped. If bleeding is not controlled within 5-10 minutes apply a cotton ball soaked with oxymetazoline (Afrin) to the bleeding nostril for a few seconds.   Call the clinic if this treatment plan is not working well for you  Follow up in 1 year or  sooner if needed.   Return in about 1 year (around 07/11/2024), or if symptoms worsen or fail to improve.    Thank you for the opportunity to care for this patient.  Please do not hesitate to contact me with questions.  Thermon Leyland, FNP [facility]

## 2023-07-12 ENCOUNTER — Ambulatory Visit: Payer: 59 | Admitting: Family Medicine

## 2023-07-12 ENCOUNTER — Encounter: Payer: Self-pay | Admitting: Family Medicine

## 2023-07-12 VITALS — BP 126/80 | HR 117 | Temp 98.3°F | Resp 18 | Ht 66.0 in | Wt 173.6 lb

## 2023-07-12 DIAGNOSIS — J3089 Other allergic rhinitis: Secondary | ICD-10-CM

## 2023-07-12 DIAGNOSIS — J329 Chronic sinusitis, unspecified: Secondary | ICD-10-CM

## 2023-07-12 DIAGNOSIS — K219 Gastro-esophageal reflux disease without esophagitis: Secondary | ICD-10-CM

## 2023-07-12 DIAGNOSIS — R040 Epistaxis: Secondary | ICD-10-CM | POA: Diagnosis not present

## 2023-07-12 DIAGNOSIS — J302 Other seasonal allergic rhinitis: Secondary | ICD-10-CM | POA: Insufficient documentation

## 2024-01-07 ENCOUNTER — Other Ambulatory Visit: Payer: Self-pay | Admitting: Allergy & Immunology

## 2024-07-15 ENCOUNTER — Ambulatory Visit: Admitting: Allergy & Immunology
